# Patient Record
Sex: Male | Born: 1945 | Race: Black or African American | Hispanic: No | Marital: Married | Smoking: Never smoker
Health system: Southern US, Community
[De-identification: ages and names within clinical notes are randomized; demographics above are authoritative.]

## PROBLEM LIST (undated history)

## (undated) DIAGNOSIS — E119 Type 2 diabetes mellitus without complications: Secondary | ICD-10-CM

## (undated) DIAGNOSIS — C61 Malignant neoplasm of prostate: Secondary | ICD-10-CM

## (undated) DIAGNOSIS — I10 Essential (primary) hypertension: Secondary | ICD-10-CM

---

## 2012-09-20 ENCOUNTER — Encounter (HOSPITAL_COMMUNITY): Payer: Self-pay

## 2012-09-20 ENCOUNTER — Emergency Department (HOSPITAL_COMMUNITY): Payer: Self-pay

## 2012-09-20 ENCOUNTER — Emergency Department (HOSPITAL_COMMUNITY)
Admission: EM | Admit: 2012-09-20 | Discharge: 2012-09-20 | Disposition: A | Discharge status: Home / Self Care | Payer: Self-pay | Attending: Emergency Medicine | Admitting: Emergency Medicine

## 2012-09-20 DIAGNOSIS — L98499 Non-pressure chronic ulcer of skin of other sites with unspecified severity: Secondary | ICD-10-CM | POA: Insufficient documentation

## 2012-09-20 DIAGNOSIS — Y929 Unspecified place or not applicable: Secondary | ICD-10-CM | POA: Insufficient documentation

## 2012-09-20 DIAGNOSIS — N529 Male erectile dysfunction, unspecified: Secondary | ICD-10-CM | POA: Insufficient documentation

## 2012-09-20 DIAGNOSIS — Y93E1 Activity, personal bathing and showering: Secondary | ICD-10-CM | POA: Insufficient documentation

## 2012-09-20 DIAGNOSIS — S92919A Unspecified fracture of unspecified toe(s), initial encounter for closed fracture: Secondary | ICD-10-CM | POA: Insufficient documentation

## 2012-09-20 DIAGNOSIS — R296 Repeated falls: Secondary | ICD-10-CM | POA: Insufficient documentation

## 2012-09-20 DIAGNOSIS — E119 Type 2 diabetes mellitus without complications: Secondary | ICD-10-CM | POA: Insufficient documentation

## 2012-09-20 DIAGNOSIS — I10 Essential (primary) hypertension: Secondary | ICD-10-CM | POA: Insufficient documentation

## 2012-09-20 HISTORY — DX: Type 2 diabetes mellitus without complications: E11.9

## 2012-09-20 HISTORY — DX: Essential (primary) hypertension: I10

## 2012-09-20 MED ORDER — HYDROCODONE-ACETAMINOPHEN 5-325 MG PO TABS: 1.0000 | ORAL_TABLET | Freq: Four times a day (QID) | ORAL | Status: DC | PRN

## 2012-09-20 NOTE — ED Notes (Signed)
Patient reports that he fell 2 months ago, hitting his right foot on the shower. Patient now states that when his right foot touches something the pain radiates all the way up the leg and into his chest. Patient also c/o of vision problems at night and not being able to drive at night.

## 2012-09-20 NOTE — ED Notes (Signed)
[facility] tech contacted

## 2012-09-20 NOTE — ED Provider Notes (Signed)
Medical screening examination/treatment/procedure(s) were conducted as a shared visit with non-physician practitioner(s) and myself.  I personally evaluated the patient during the encounter On my exam the patient was in no distress.  He specifically requests additional evaluation of a right anterior calf lesion.  This was well healing and appearance with no surrounding erythema or discharge.  We discussed the need for primary care evaluation of his other complaints.  Absent distress he was discharged in stable condition  Gerhard Munch, MD 09/20/12 [phone]

## 2012-09-20 NOTE — ED Provider Notes (Signed)
History     CSN: [id number]  Arrival date & time 09/20/12  8295   First MD Initiated Contact with Patient 09/20/12 0848      Chief Complaint  Patient presents with  . Foot Pain  . Eye Problem    (Consider location/radiation/quality/duration/timing/severity/associated sxs/prior treatment) HPI Comments: This is a 66 year old male, who presents emergency department with chief complaint of right foot pain. Patient states that he is visiting from [place]. States that approximately 2 months ago he fell while showering, and sent than his right second toe has been swollen. He has not taken anything to alleviate his symptoms. States that the pain is worse when his toe is touched. Additionally, he complains of worsening visual acuity and erectile dysfunction. He denies headache, pain with eye movement, eye pain in general, chest pain, shortness of breath, nausea, vomiting, diarrhea, constipation, numbness or tingling of the extremities. States that the pain in his toe is moderate.  The history is provided by the patient. No language interpreter was used.    Past Medical History  Diagnosis Date  . Hypertension   . Diabetes mellitus without complication     History reviewed. No pertinent past surgical history.  No family history on file.  History  Substance Use Topics  . Smoking status: Never Smoker   . Smokeless tobacco: Never Used  . Alcohol Use: No      Review of Systems  All other systems reviewed and are negative.    Allergies  Review of patient's allergies indicates not on file.  Home Medications  No current outpatient prescriptions on file.  BP 160/82  Pulse 75  Temp 98.5 F (36.9 C) (Oral)  Resp 16  SpO2 97%  Physical Exam  Nursing note and vitals reviewed. Constitutional: He is oriented to person, place, and time. He appears well-developed and well-nourished.  HENT:  Head: Normocephalic and atraumatic.  Mouth/Throat: Oropharynx is clear and moist. No  oropharyngeal exudate.  Eyes: Conjunctivae normal and EOM are normal. Pupils are equal, round, and reactive to light. Right eye exhibits no discharge. Left eye exhibits no discharge. No scleral icterus.  Neck: Normal range of motion. Neck supple. No JVD present.  Cardiovascular: Normal rate, regular rhythm, normal heart sounds and intact distal pulses.  Exam reveals no gallop and no friction rub.   No murmur heard. Pulmonary/Chest: Effort normal and breath sounds normal. No respiratory distress. He has no wheezes. He has no rales. He exhibits no tenderness.  Abdominal: Soft. Bowel sounds are normal. He exhibits no distension and no mass. There is no tenderness. There is no rebound and no guarding.  Musculoskeletal: Normal range of motion. He exhibits tenderness. He exhibits no edema.       Right second toe tender to palpation, mildly swollen, with darkened skin.  Neurological: He is alert and oriented to person, place, and time. He has normal reflexes.       CN 3-12 intact  Skin: Skin is warm and dry.       Small ulceration of the right shin proximally 2 cm in diameter, appears to be Wagner grade 1  Psychiatric: He has a normal mood and affect. His behavior is normal. Judgment and thought content normal.    ED Course  Procedures (including critical care time)  No results found for this or any previous visit. Dg Foot Complete Right  09/20/2012  *RADIOLOGY REPORT*  Clinical Data: Larey Seat 2 months ago.  Pain and swelling of the second toe.  RIGHT FOOT COMPLETE -  3+ VIEW  Comparison: None.  Findings: There is an oblique fracture of the distal aspect of the proximal phalanx of the second toe which does extend to the articular surface.  No displacement or angulation.  There are mild degenerative changes of the metatarsal phalangeal joint of the great toe.  There are mild degenerative changes in the midfoot.  IMPRESSION: Fracture of the distal aspect of the proximal phalanx of the second toe extending to  the articular surface.  If this fracture is 2 months old, there is nonunion at this time.   Original Report Authenticated By: Paulina Fusi, M.D.       1. Toe fracture   2. Diabetes   3. Skin ulcer   4. ED (erectile dysfunction)       MDM   66 year old male with right toe fracture. I will give the patient a postop shoe, pain medicines, and orthopedic followup. Patient advised to followup with ophthalmology, and primary care for his other complaints. Patient is agreeable with this plan. Patient is stable and ready for discharge. Patient has been discussed with Dr. Jeraldine Loots, who agrees with the plan.       Roxy Horseman, PA-C 09/20/12 [phone]

## 2012-09-20 NOTE — ED Notes (Addendum)
Pt reports R foot, no obvious deformity noted at this time.  Pt also has a note "from my wife" stating that his penis does not work.  Pt also reports blurred vision at night.

## 2018-08-04 ENCOUNTER — Other Ambulatory Visit (HOSPITAL_COMMUNITY): Payer: Self-pay | Admitting: Urology

## 2018-08-04 DIAGNOSIS — C61 Malignant neoplasm of prostate: Secondary | ICD-10-CM

## 2018-08-20 ENCOUNTER — Ambulatory Visit (HOSPITAL_COMMUNITY)
Admission: RE | Admit: 2018-08-20 | Discharge: 2018-08-20 | Disposition: A | Discharge status: Home / Self Care | Payer: Self-pay | Source: Ambulatory Visit | Attending: Urology | Admitting: Urology

## 2018-08-20 DIAGNOSIS — C61 Malignant neoplasm of prostate: Secondary | ICD-10-CM

## 2018-08-20 MED ORDER — TECHNETIUM TC 99M MEDRONATE IV KIT
20.6000 | PACK | Freq: Once | INTRAVENOUS | Status: AC | PRN
  Administered 2018-08-20: 20.6 via INTRAVENOUS

## 2018-09-17 ENCOUNTER — Encounter: Payer: Self-pay | Admitting: Radiation Oncology

## 2018-09-17 NOTE — Progress Notes (Signed)
GU Location of Tumor / Histology: prostatic adenocarcinoma  If Prostate Cancer, Gleason Score is (4 + 4) and PSA is (96). Prostate volume: 87 cc.  Shawntez Dickison was referred by Dr. Orma Render to Dr. Karsten Ro in September 2019 for further evaluation of an elevated PSA.   Biopsies of prostate (if applicable) revealed:    Past/Anticipated interventions by urology, if any: prostate biopsy, CT (neg), bone scan (neg), referral to radiation oncology  Past/Anticipated interventions by medical oncology, if any: no  Weight changes, if any: no  Bowel/Bladder complaints, if any: IPSS 10. SHIM 1. Reports dysuria. Denies hematuria, leakage or incontinence.   Nausea/Vomiting, if any: no  Pain issues, if any:  no  SAFETY ISSUES:  Prior radiation? no  Pacemaker/ICD? no  Possible current pregnancy? no  Is the patient on methotrexate? no  Current Complaints / other details:  72 year old male. Married with two sons and one daughter. Brother with prostate cancer. NKDA. Instructed to follow up with PCP reference cough x 2 weeks.

## 2018-09-20 ENCOUNTER — Ambulatory Visit
Admission: RE | Admit: 2018-09-20 | Discharge: 2018-09-20 | Disposition: A | Discharge status: Home / Self Care | Payer: Self-pay | Source: Ambulatory Visit | Attending: Radiation Oncology | Admitting: Radiation Oncology

## 2018-09-20 ENCOUNTER — Other Ambulatory Visit: Payer: Self-pay

## 2018-09-20 ENCOUNTER — Encounter: Payer: Self-pay | Admitting: Medical Oncology

## 2018-09-20 ENCOUNTER — Encounter: Payer: Self-pay | Admitting: Radiation Oncology

## 2018-09-20 VITALS — BP 161/68 | HR 110 | Temp 99.5°F | Resp 20 | Wt 216.6 lb

## 2018-09-20 DIAGNOSIS — C61 Malignant neoplasm of prostate: Secondary | ICD-10-CM | POA: Insufficient documentation

## 2018-09-20 HISTORY — DX: Malignant neoplasm of prostate: C61

## 2018-09-20 NOTE — Progress Notes (Signed)
See progress note under physician encounter. 

## 2018-09-20 NOTE — Progress Notes (Signed)
Introduced myself to Mr. Oyster and his family as the prostate nurse navigator and my role. He is referred today for radiation and Lupron. He is not a natural citizen of the [place] and is here on a visa. He is planning to return home to [place] in a few weeks for a visit and returning March for radiation treatment. Due to no insurance, he needs assistance with Lupron. I spoke with Rob Kimble-drug replacement specialist will contact the daughter Alma Friendly after he investigates drug replacement. Alma Friendly is aware we will contact her regarding Lupron. I gave her my business card and asked her to call with questions or concerns.

## 2018-09-20 NOTE — Progress Notes (Signed)
2 Radiation Oncology         [phone] ________________________________  Initial Outpatient Consultation  Name: Caleb Robbins MRN: [id number]  Date: 09/20/2018  DOB: [birth year]  LY:YTKPTWS, No Pcp Per  Kathie Rhodes, MD   REFERRING PHYSICIAN: Kathie Rhodes, MD  DIAGNOSIS: 72 y.o. 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    ICD-10-CM   1. Malignant neoplasm of prostate (HCC) C61 Leuprolide Acetate (6 Month) (LUPRON) injection 45 mg    HISTORY OF PRESENT ILLNESS: Caleb Robbins is a 72 y.o. male with a new diagnosis of prostate cancer. He was noted to have an elevated PSA of 96 by his primary care physician, Dr. Orma Render.  Accordingly, he was referred for evaluation in urology by Dr. Karsten Ro on 06/22/18,  digital rectal examination was performed at that time revealing symmetrical abnormal lobe nodule.  The patient proceeded to transrectal ultrasound with 12 biopsies of the prostate on 07/27/18.  The prostate volume measured 87 cc.  Out of 12 core biopsies, 10 were positive.  The maximum Gleason score was 4+4, and this was seen in all positive cores.  The capsule appeared intact.  He underwent staging abdomen/pelvis CT and bone scans on 08/20/18 showing no findings of adenopathy or osseous metastatic disease. The patient reviewed the biopsy results with his urologist and he has kindly been referred today for discussion of potential radiation treatment options.   PREVIOUS RADIATION THERAPY: No  PAST MEDICAL HISTORY:  Past Medical History:  Diagnosis Date  . Diabetes mellitus without complication ([place])   . Hypertension   . Prostate cancer ([place])       PAST SURGICAL HISTORY:History reviewed. No pertinent surgical history.  FAMILY HISTORY:  Family History  Problem Relation Age of Onset  . Prostate cancer Brother   . Breast cancer Neg Hx   . Colon cancer Neg Hx   . Pancreatic cancer Neg Hx     SOCIAL HISTORY:    Social History   Socioeconomic History  . Marital status: Married    Spouse name: Not on file  . Number of children: 4  . Years of education: Not on file  . Highest education level: Not on file  Occupational History  . Not on file  Social Needs  . Financial resource strain: Not on file  . Food insecurity:    Worry: Not on file    Inability: Not on file  . Transportation needs:    Medical: Not on file    Non-medical: Not on file  Tobacco Use  . Smoking status: Never Smoker  . Smokeless tobacco: Never Used  Substance and Sexual Activity  . Alcohol use: No  . Drug use: No  . Sexual activity: Not Currently  Lifestyle  . Physical activity:    Days per week: Not on file    Minutes per session: Not on file  . Stress: Not on file  Relationships  . Social connections:    Talks on phone: Not on file    Gets together: Not on file    Attends religious service: Not on file    Active member of club or organization: Not on file    Attends meetings of clubs or organizations: Not on file    Relationship status: Not on file  . Intimate partner violence:    Fear of current or ex partner: Not on file    Emotionally abused: Not on file    Physically  abused: Not on file    Forced sexual activity: Not on file  Other Topics Concern  . Not on file  Social History Narrative  . Not on file  He is accompanied by his wife and daughter. He elects his daughter as his interpreter. She states he is came to visit from [place] about 2 months ago on a VISA and plans to return to [place] in the spring.  ALLERGIES: Patient has no known allergies.  MEDICATIONS:  Current Outpatient Medications  Medication Sig Dispense Refill  . Dextromethorphan-guaiFENesin (DIABETIC TUSSIN DM PO) Take by mouth.    . Garlic 4196 MG CAPS Take 1 capsule by mouth daily.    Marland Kitchen glimepiride (AMARYL) 2 MG tablet Take 2 mg by mouth daily with breakfast.    . losartan (COZAAR) 50 MG tablet Take 50 mg by mouth daily.    .  metFORMIN (GLUCOPHAGE) 500 MG tablet Take 500 mg by mouth 2 (two) times daily with a meal.    . vitamin B-12 (CYANOCOBALAMIN) 1000 MCG tablet Take 1,000 mcg by mouth daily.     No current facility-administered medications for this encounter.     REVIEW OF SYSTEMS:  On review of systems, the patient reports that he is doing well overall. He denies any chest pain, shortness of breath, cough, fevers, chills, night sweats, unintended weight changes. He denies any bowel disturbances, and denies abdominal pain, nausea or vomiting. He denies any new musculoskeletal or joint aches or pains. His IPSS was 10, indicating moderate urinary symptoms. He reports dysuria and denies hematuria and leakage or incontinence. He is unable to complete sexual activity. A complete review of systems is obtained and is otherwise negative.    PHYSICAL EXAM:  Wt Readings from Last 3 Encounters:  09/20/18 216 lb 9.6 oz (98.2 kg)   Temp Readings from Last 3 Encounters:  09/20/18 99.5 F (37.5 C) (Oral)  09/20/12 98.5 F (36.9 C) (Oral)   BP Readings from Last 3 Encounters:  09/20/18 (!) 161/68  09/20/12 160/82   Pulse Readings from Last 3 Encounters:  09/20/18 (!) 110  09/20/12 75   Pain Assessment Pain Score: 0-No pain/10  In general this is a well appearing African gentleman in no acute distress. He is alert and oriented x4 and appropriate throughout the examination. HEENT reveals that the patient is normocephalic, atraumatic. EOMs are intact.  Skin is intact without any evidence of gross lesions. Cardiopulmonary assessment is negative for acute distress and breathing exhibits normal effort.    KPS = 100  100 - Normal; no complaints; no evidence of disease. 90   - Able to carry on normal activity; minor signs or symptoms of disease. 80   - Normal activity with effort; some signs or symptoms of disease. 25   - Cares for self; unable to carry on normal activity or to do active work. 60   - Requires  occasional assistance, but is able to care for most of his personal needs. 50   - Requires considerable assistance and frequent medical care. 58   - Disabled; requires special care and assistance. 58   - Severely disabled; hospital admission is indicated although death not imminent. 66   - Very sick; hospital admission necessary; active supportive treatment necessary. 10   - Moribund; fatal processes progressing rapidly. 0     - Dead  Karnofsky DA, Abelmann WH, Craver LS and Burchenal [facility] [phone]) The use of the nitrogen mustards in the palliative treatment of carcinoma: with particular  reference to bronchogenic carcinoma Cancer 1 634-56  LABORATORY DATA:  No results found for: WBC, HGB, HCT, MCV, PLT No results found for: NA, K, CL, CO2 No results found for: ALT, AST, GGT, ALKPHOS, BILITOT   RADIOGRAPHY: No results found.    IMPRESSION/PLAN: 1. 72 y.o. 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 We discussed the patient's workup and outlines the nature of prostate cancer in this setting. The patient's T stage, Gleason's score, and PSA put him into the high risk group.  Accordingly, he is eligible for 8 weeks of external radiation with 2 years of androgen depravation. We discussed the available radiation techniques, and focused on the details and logistics and delivery. We discussed and outlined the risks, benefits, short and long-term effects associated with radiotherapy. Given the lack of insurance, our navigator is trying to coordinate indigent options for his lupron to be administered here at the cancer center.   In a visit lasting 60 minutes, greater than 50% of the time was spent face to face discussing his case, and coordinating the patient's care.    Carola Rhine, PAC    Tyler Pita, MD  [facility] Direct Dial: [phone]  Fax: [phone] [email or web address]  Skype  [email or web address]   This  document serves as a record of services personally performed by Shona Simpson, PA-C and Dr. Tammi Klippel. It was created on their behalf by Wilburn Mylar, a trained medical scribe. The creation of this record is based on the scribe's personal observations and the provider's statements to them. This document has been checked and approved by the attending provider.

## 2018-09-21 ENCOUNTER — Telehealth: Payer: Self-pay | Admitting: Medical Oncology

## 2018-09-22 ENCOUNTER — Encounter: Payer: Self-pay | Admitting: Pharmacy Technician

## 2018-09-22 NOTE — Progress Notes (Signed)
The patient is approved for drug assistance by [employer] for Lupron 45 mg.  Enrollment is effective until 09/22/19 and is based on self pay. The patient is expected to be scheduled for the first injection appointment before the end of the year.

## 2018-09-23 ENCOUNTER — Encounter: Payer: Self-pay | Admitting: Internal Medicine

## 2018-09-23 ENCOUNTER — Telehealth: Payer: Self-pay | Admitting: Radiation Oncology

## 2018-09-23 ENCOUNTER — Encounter: Payer: Self-pay | Admitting: Medical Oncology

## 2018-09-23 ENCOUNTER — Telehealth: Payer: Self-pay | Admitting: Medical Oncology

## 2018-09-23 NOTE — Telephone Encounter (Signed)
Spoke with Alma Friendly to confirm he father will be able come and sign forms for drug assistance-Lupron. I informed her, once we hear back and he is approved, we will get him scheduled for injection. She  Voiced understanding and is grateful for the care her father is receiving. I asked her to call with questions or concerns and I will continue to follow.

## 2018-09-23 NOTE — Progress Notes (Signed)
Rob Chadbourn notified me that patient was approved for Lupron replacement. He asked that patient be scheduled early next week for injection. POF sent for Monday 12/16. Patient will be notified.

## 2018-09-23 NOTE — Telephone Encounter (Signed)
Left message with Lydia-daughter that her father was approved for Lupron assistance. He is scheduled for injection 12/16 at 8:15 am. I asked her to call me to confirm appointment.

## 2018-09-23 NOTE — Telephone Encounter (Signed)
Scheduled appt per 12/12 sch message - pt daughter is aware of appt date and time

## 2018-09-27 ENCOUNTER — Inpatient Hospital Stay: Payer: Self-pay | Attending: Radiation Oncology

## 2018-09-27 ENCOUNTER — Encounter: Payer: Self-pay | Admitting: Medical Oncology

## 2018-09-27 DIAGNOSIS — Z79818 Long term (current) use of other agents affecting estrogen receptors and estrogen levels: Secondary | ICD-10-CM | POA: Insufficient documentation

## 2018-09-27 DIAGNOSIS — C61 Malignant neoplasm of prostate: Secondary | ICD-10-CM | POA: Insufficient documentation

## 2018-09-27 MED ORDER — LEUPROLIDE ACETATE (6 MONTH) 45 MG IM KIT
45.0000 mg | PACK | Freq: Once | INTRAMUSCULAR | Status: AC
  Administered 2018-09-27: 45 mg via INTRAMUSCULAR
  Filled 2018-09-27: qty 45

## 2018-09-27 NOTE — Progress Notes (Signed)
Pt stated he had not taken his BP meds and will take upon arrival home. Pt denies any headache or any complaints. Son with patient who verbalized understanding of importance of BP medication. Pt and son left ambulatory in no distress and had no further questions.

## 2018-09-27 NOTE — Patient Instructions (Signed)

## 2018-10-08 ENCOUNTER — Telehealth: Payer: Self-pay | Admitting: Medical Oncology

## 2018-10-08 NOTE — Telephone Encounter (Signed)
Lydia-daughter called stating that her father received a bill for consult with Dr. Tammi Klippel. We were able to provide him with Lupron thru drug replacement program. I informed her that Ailene Ravel from the radiation financial office will call her regarding patient assistance programs. He voiced understanding.

## 2018-10-20 ENCOUNTER — Encounter: Payer: Self-pay | Admitting: Pharmacy Technician

## 2018-10-20 NOTE — Progress Notes (Signed)
I contacted Alma Friendly, the patient's daughter about billing and patient assistance for his Lupron injections. I explained that the bill she received was processed before [facility] received the drug assistance enrollment approval and that I had contacted billing about his approval. She also asked about the assistance for his radiation treatments. I explained that I could only help with the cost of the injection drug and that I was unsure if assistance is available for radiation treatments and the doctor bills.  I told the patient that I would reach out to Cira Rue and ask her to call her back about any potential financial assistance.

## 2018-12-17 ENCOUNTER — Telehealth: Payer: Self-pay | Admitting: Radiation Oncology

## 2018-12-17 NOTE — Telephone Encounter (Signed)
Received voicemail message requesting return call about date next ADT injection is due. Phoned back. No answer. Left message on daughter, Lydia's, voicemail. Explained next injection is due in June. Explained her father should receive an ADT injection every six months for two years. Provided my contact number and requested she phone back because if her father has returned from [place] we need to gear up radiation. Awaiting return call.

## 2020-03-09 ENCOUNTER — Other Ambulatory Visit: Payer: Self-pay | Admitting: Urology

## 2020-03-09 ENCOUNTER — Other Ambulatory Visit (HOSPITAL_COMMUNITY): Payer: Self-pay | Admitting: Urology

## 2020-03-09 DIAGNOSIS — C61 Malignant neoplasm of prostate: Secondary | ICD-10-CM

## 2020-03-15 ENCOUNTER — Ambulatory Visit
Admission: RE | Admit: 2020-03-15 | Payer: Self-pay | Source: Ambulatory Visit | Attending: Radiation Oncology | Admitting: Radiation Oncology

## 2020-03-15 ENCOUNTER — Ambulatory Visit: Payer: Self-pay

## 2020-03-23 ENCOUNTER — Other Ambulatory Visit: Payer: Self-pay

## 2020-03-23 ENCOUNTER — Encounter (HOSPITAL_COMMUNITY)
Admission: RE | Admit: 2020-03-23 | Discharge: 2020-03-23 | Disposition: A | Discharge status: Home / Self Care | Payer: Self-pay | Source: Ambulatory Visit | Attending: Urology | Admitting: Urology

## 2020-03-23 ENCOUNTER — Encounter (HOSPITAL_COMMUNITY): Payer: Self-pay

## 2020-03-23 ENCOUNTER — Ambulatory Visit (HOSPITAL_COMMUNITY)
Admission: RE | Admit: 2020-03-23 | Discharge: 2020-03-23 | Disposition: A | Discharge status: Home / Self Care | Payer: Self-pay | Source: Ambulatory Visit | Attending: Urology | Admitting: Urology

## 2020-03-23 DIAGNOSIS — C61 Malignant neoplasm of prostate: Secondary | ICD-10-CM | POA: Insufficient documentation

## 2020-03-23 LAB — POCT I-STAT CREATININE: Creatinine, Ser: 1.4 mg/dL — ABNORMAL HIGH (ref 0.61–1.24)

## 2020-03-23 MED ORDER — SODIUM CHLORIDE (PF) 0.9 % IJ SOLN
INTRAMUSCULAR | Status: AC
  Filled 2020-03-23: qty 50

## 2020-03-23 MED ORDER — IOHEXOL 300 MG/ML  SOLN
100.0000 mL | Freq: Once | INTRAMUSCULAR | Status: AC | PRN
  Administered 2020-03-23: 100 mL via INTRAVENOUS

## 2020-03-23 MED ORDER — TECHNETIUM TC 99M MEDRONATE IV KIT
20.0000 | PACK | Freq: Once | INTRAVENOUS | Status: AC | PRN
  Administered 2020-03-23: 22 via INTRAVENOUS

## 2020-04-06 ENCOUNTER — Encounter: Payer: Self-pay | Admitting: Radiation Oncology

## 2020-04-06 ENCOUNTER — Ambulatory Visit
Admission: RE | Admit: 2020-04-06 | Discharge: 2020-04-06 | Disposition: A | Discharge status: Home / Self Care | Payer: Self-pay | Source: Ambulatory Visit | Attending: Radiation Oncology | Admitting: Radiation Oncology

## 2020-04-06 ENCOUNTER — Other Ambulatory Visit: Payer: Self-pay

## 2020-04-06 ENCOUNTER — Encounter: Payer: Self-pay | Admitting: Medical Oncology

## 2020-04-06 ENCOUNTER — Encounter (INDEPENDENT_AMBULATORY_CARE_PROVIDER_SITE_OTHER): Payer: Self-pay

## 2020-04-06 ENCOUNTER — Ambulatory Visit
Admission: RE | Admit: 2020-04-06 | Discharge: 2020-04-06 | Disposition: A | Discharge status: Home / Self Care | Payer: Self-pay | Source: Ambulatory Visit | Attending: Urology | Admitting: Urology

## 2020-04-06 VITALS — BP 168/76 | HR 64 | Temp 97.2°F | Resp 20 | Ht 66.0 in | Wt 211.0 lb

## 2020-04-06 DIAGNOSIS — I10 Essential (primary) hypertension: Secondary | ICD-10-CM | POA: Insufficient documentation

## 2020-04-06 DIAGNOSIS — C61 Malignant neoplasm of prostate: Secondary | ICD-10-CM

## 2020-04-06 DIAGNOSIS — Z7984 Long term (current) use of oral hypoglycemic drugs: Secondary | ICD-10-CM | POA: Insufficient documentation

## 2020-04-06 DIAGNOSIS — Z79899 Other long term (current) drug therapy: Secondary | ICD-10-CM | POA: Insufficient documentation

## 2020-04-06 DIAGNOSIS — E119 Type 2 diabetes mellitus without complications: Secondary | ICD-10-CM | POA: Insufficient documentation

## 2020-04-06 MED ORDER — LEUPROLIDE ACETATE (6 MONTH) 45 MG IM KIT: 45.0000 mg | PACK | Freq: Once | INTRAMUSCULAR | Status: DC

## 2020-04-06 NOTE — Progress Notes (Signed)
Patient states nocturia 4-5 times. Patient states having mild dysuria. Patient denies hematuria. Patient states bowel movements are regular. Patient states urine stream is strong and steady. Patient states that he is emptying his bladder completely. Patient states having urgency and sometimes he is able to make it to the bathroom. Patient denies having leakage. Patient denies having hesitancy or straining.  BP (!) 168/76 (BP Location: Right Arm, Patient Position: Sitting, Cuff Size: Large)   Pulse 64   Temp (!) 97.2 F (36.2 C)   Resp 20   Ht 5\' 6"  (1.676 m)   Wt 211 lb (95.7 kg)   SpO2 99%   BMI 34.06 kg/m

## 2020-04-06 NOTE — Progress Notes (Signed)
Spoke with Caleb Robbins to see if patient can return to [facility] to sign forms for patient assistance with [employer]. She can bring him in today. I informed her, once he gets approval and we have drug, we will schedule him for the injeciton. She voiced understanding.Pharmacy notified of the above.

## 2020-04-06 NOTE — Progress Notes (Signed)
I reintroduced myself to patient and his daughter, Caleb Robbins. I met them in 2019 when he consulted with Dr. Tammi Klippel and started ADT.  He returned home to [place] and was unable to return to the states, due to COVID restrictions. He re-consulted today with Ashlyn, PA and will restart ADT and then be scheduled for radiation.

## 2020-04-06 NOTE — Progress Notes (Signed)
2 Radiation Oncology         [phone] ________________________________   Outpatient Re-Consultation  Name: Issiah Huffaker MRN: [id number]  Date: 04/06/2020  DOB: May 01, 1946  MB:[id number], No Pcp Per  Kathie Rhodes, MD   REFERRING PHYSICIAN: Kathie Rhodes, MD  DIAGNOSIS: 74 y.o. 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    ICD-10-CM   1. Malignant neoplasm of prostate ([place])  C61     HISTORY OF PRESENT ILLNESS: Clifford Benninger is a 74 y.o. male with a diagnosis of prostate cancer. He was initially noted to have an elevated PSA of 96 by his primary care physician, Dr. Orma Render.  Accordingly, he was referred for evaluation in urology by Dr. Karsten Ro on 06/22/18,  digital rectal examination was performed at that time revealing symmetrical abnormal lobe nodules.  The patient proceeded to transrectal ultrasound with 12 biopsies of the prostate on 07/27/18.  The prostate volume measured 87 cc.  Out of 12 core biopsies, 10 were positive.  The maximum Gleason score was 4+4, and this was seen in all positive cores.  The capsule appeared intact.  He underwent staging abdomen/pelvis CT and bone scans on 08/20/18 showing no findings of adenopathy or osseous metastatic disease. The patient reviewed the biopsy results with his urologist and was kindly referred to [place] for discussion of potential radiation treatment options. We initially met with him and his daughter on 09/20/2018 and the decision at that time was to proceed with ADT concurrent with 8 weeks of prostate IMRT. He received a 6 month Lupron injection on 09/27/2018 with plans to move forward with radiation approximately 2 months thereafter.  However, the patient went back home to [place] and due to COVID-19 restrictions, was unable to get back into the [place] until recently.  He did not follow up with Dr. Karsten Ro until 03/06/20.  A repeat PSA was performed at that time and was further elevated at  122. He has not received any further ADT since his initial injection.  He returns today to resume treatment.   PREVIOUS RADIATION THERAPY: No  PAST MEDICAL HISTORY:  Past Medical History:  Diagnosis Date  . Diabetes mellitus without complication ([place])   . Hypertension   . Prostate cancer ([place])       PAST SURGICAL HISTORY:No past surgical history on file.  FAMILY HISTORY:  Family History  Problem Relation Age of Onset  . Prostate cancer Brother   . Breast cancer Neg Hx   . Colon cancer Neg Hx   . Pancreatic cancer Neg Hx     SOCIAL HISTORY:  Social History   Socioeconomic History  . Marital status: Married    Spouse name: Not on file  . Number of children: 4  . Years of education: Not on file  . Highest education level: Not on file  Occupational History  . Not on file  Tobacco Use  . Smoking status: Never Smoker  . Smokeless tobacco: Never Used  Vaping Use  . Vaping Use: Never used  Substance and Sexual Activity  . Alcohol use: No  . Drug use: No  . Sexual activity: Not Currently  Other Topics Concern  . Not on file  Social History Narrative  . Not on file   Social Determinants of Health   Financial Resource Strain:   . Difficulty of Paying Living Expenses:   Food Insecurity:   . Worried About Charity fundraiser in the Last  Year:   . Ran Out of Food in the Last Year:   Transportation Needs:   . Film/video editor (Medical):   Marland Kitchen Lack of Transportation (Non-Medical):   Physical Activity:   . Days of Exercise per Week:   . Minutes of Exercise per Session:   Stress:   . Feeling of Stress :   Social Connections:   . Frequency of Communication with Friends and Family:   . Frequency of Social Gatherings with Friends and Family:   . Attends Religious Services:   . Active Member of Clubs or Organizations:   . Attends Archivist Meetings:   Marland Kitchen Marital Status:   Intimate Partner Violence:   . Fear of Current or Ex-Partner:   . Emotionally  Abused:   Marland Kitchen Physically Abused:   . Sexually Abused:   He is accompanied by his wife and daughter. He elects his daughter as his interpreter. She states he is came to visit from [place] about 2 months ago on a VISA and plans to return to [place] in the spring.  ALLERGIES: Patient has no known allergies.  MEDICATIONS:  Current Outpatient Medications  Medication Sig Dispense Refill  . Dextromethorphan-guaiFENesin (DIABETIC TUSSIN DM PO) Take by mouth.    . Garlic 7829 MG CAPS Take 1 capsule by mouth daily.    Marland Kitchen glimepiride (AMARYL) 2 MG tablet Take 2 mg by mouth daily with breakfast.    . losartan (COZAAR) 50 MG tablet Take 50 mg by mouth daily.    . metFORMIN (GLUCOPHAGE) 500 MG tablet Take 500 mg by mouth 2 (two) times daily with a meal.    . vitamin B-12 (CYANOCOBALAMIN) 1000 MCG tablet Take 1,000 mcg by mouth daily.     No current facility-administered medications for this visit.    REVIEW OF SYSTEMS:  On review of systems, the patient reports that he is doing well overall. He denies any chest pain, shortness of breath, cough, fevers, chills, night sweats, unintended weight changes. He denies any bowel disturbances, and denies abdominal pain, nausea or vomiting. He denies any new musculoskeletal or joint aches or pains. His IPSS was 10, indicating moderate urinary symptoms. He reports dysuria and denies hematuria and leakage or incontinence. He is unable to complete sexual activity. A complete review of systems is obtained and is otherwise negative.    PHYSICAL EXAM:  Wt Readings from Last 3 Encounters:  09/20/18 216 lb 9.6 oz (98.2 kg)   Temp Readings from Last 3 Encounters:  09/27/18 98.9 F (37.2 C) (Oral)  09/20/18 99.5 F (37.5 C) (Oral)  09/20/12 98.5 F (36.9 C) (Oral)   BP Readings from Last 3 Encounters:  09/27/18 (!) 194/93  09/20/18 (!) 161/68  09/20/12 160/82   Pulse Readings from Last 3 Encounters:  09/27/18 88  09/20/18 (!) 110  09/20/12 75    /10  In general  this is a well appearing African gentleman in no acute distress. He is alert and oriented x4 and appropriate throughout the examination. HEENT reveals that the patient is normocephalic, atraumatic. EOMs are intact.  Skin is intact without any evidence of gross lesions. Cardiopulmonary assessment is negative for acute distress and breathing exhibits normal effort.    KPS = 100  100 - Normal; no complaints; no evidence of disease. 90   - Able to carry on normal activity; minor signs or symptoms of disease. 80   - Normal activity with effort; some signs or symptoms of disease. 70   - Cares  for self; unable to carry on normal activity or to do active work. 60   - Requires occasional assistance, but is able to care for most of his personal needs. 50   - Requires considerable assistance and frequent medical care. 32   - Disabled; requires special care and assistance. 43   - Severely disabled; hospital admission is indicated although death not imminent. 21   - Very sick; hospital admission necessary; active supportive treatment necessary. 10   - Moribund; fatal processes progressing rapidly. 0     - Dead  Karnofsky DA, Abelmann WH, Craver LS and Burchenal JH [phone]) The use of the nitrogen mustards in the palliative treatment of carcinoma: with particular reference to bronchogenic carcinoma Cancer 1 [id number]  LABORATORY DATA:  No results found for: WBC, HGB, HCT, MCV, PLT No results found for: NA, K, CL, CO2 No results found for: ALT, AST, GGT, ALKPHOS, BILITOT   RADIOGRAPHY: NM Bone Scan Whole Body  Result Date: 03/26/2020 CLINICAL DATA:  Prostate cancer with elevated serum PSA level. No acute injury or bone pain. EXAM: NUCLEAR MEDICINE WHOLE BODY BONE SCAN TECHNIQUE: Whole body anterior and posterior images were obtained approximately 3 hours after intravenous injection of radiopharmaceutical. RADIOPHARMACEUTICALS:  22.0 mCi Technetium-19mMDP IV COMPARISON:  Whole-body bone scan 08/20/2018.  Abdominopelvic CT 03/23/2020. FINDINGS: There is no osseous uptake suspicious for metastatic disease. Minimal degenerative uptake is present within the spine, similar to previous study. The soft tissue activity appears normal. IMPRESSION: Stable examination.  No evidence of osseous metastatic disease. Electronically Signed   By: WRichardean SaleM.D.   On: 03/26/2020 09:07   CT ABDOMEN PELVIS W CONTRAST  Result Date: 03/24/2020 CLINICAL DATA:  Prostate cancer.  Restaging.  PSA 122 on 03/07/2020. EXAM: CT ABDOMEN AND PELVIS WITH CONTRAST TECHNIQUE: Multidetector CT imaging of the abdomen and pelvis was performed using the standard protocol following bolus administration of intravenous contrast. CONTRAST:  1091mOMNIPAQUE IOHEXOL 300 MG/ML  SOLN COMPARISON:  08/20/2018 CT abdomen/pelvis. FINDINGS: Lower chest: No significant pulmonary nodules or acute consolidative airspace disease. Hepatobiliary: Normal liver size. No liver mass. Normal gallbladder with no radiopaque cholelithiasis. No biliary ductal dilatation. Pancreas: Normal, with no mass or duct dilation. Spleen: Normal size. No mass. Adrenals/Urinary Tract: Normal adrenals. No hydronephrosis. No renal masses. Chronic diffuse bladder wall thickening is not definitely changed. No significant bladder distention. Stomach/Bowel: Normal non-distended stomach. Normal caliber small bowel with no small bowel wall thickening. Normal appendix. Normal large bowel with no diverticulosis, large bowel wall thickening or pericolonic fat stranding. Vascular/Lymphatic: Atherosclerotic nonaneurysmal abdominal aorta. Patent portal, splenic, hepatic and renal veins. No pathologically enlarged lymph nodes in the abdomen or pelvis. Reproductive: Marked prostatomegaly. Prostate with 6.0 cm, previously 5.8 cm, minimally increased. Heterogeneous prostate enhancement. Other: No pneumoperitoneum, ascites or focal fluid collection. Musculoskeletal: No discrete osseous lesions. Moderate  thoracolumbar spondylosis. IMPRESSION: 1. No lymphadenopathy or other evidence of metastatic disease in the abdomen or pelvis. 2. Chronic diffuse bladder wall thickening, probably due to chronic bladder outlet obstruction by the markedly enlarged heterogeneous prostate. 3. Aortic Atherosclerosis (ICD10-I70.0). Electronically Signed   By: JaIlona Sorrel.D.   On: 03/24/2020 20:47      IMPRESSION/PLAN: 1. [age].o. 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 We discussed the patient's workup and outlines the nature of prostate cancer in this setting. The patient's T stage, Gleason's score, and PSA put him into the high risk  group.  Accordingly, he is eligible for 8 weeks of external radiation with 2 years of androgen deprivation. We discussed the available radiation techniques, and focused on the details and logistics and delivery. We discussed and outlined the risks, benefits, short and long-term effects associated with radiotherapy. Given the lack of insurance, our navigator is trying to coordinate indigent options for his lupron to be administered here at the cancer center.     Nicholos Johns, PA-C    Tyler Pita, MD  [facility] Direct Dial: [phone]  Fax: [phone] [email or web address]  Skype  LinkedIn

## 2020-04-09 ENCOUNTER — Encounter: Payer: Self-pay | Admitting: *Deleted

## 2020-04-09 ENCOUNTER — Encounter: Payer: Self-pay | Admitting: Radiation Oncology

## 2020-04-09 NOTE — Progress Notes (Signed)
Received referral from Michelle(Social worker) regarding financial assistance/grant.  Forwarded message to [place] to follow up due to patient being in Radiation.  Adrienne(Radiation Estate manager/land agent) and Neurosurgeon) connecting w/patient.

## 2020-04-09 NOTE — Progress Notes (Signed)
[place] Psychosocial Distress Screening Clinical Social Work  Clinical Social Work was referred by distress screening protocol.  The patient scored a 10 on the Psychosocial Distress Thermometer which indicates severe distress. Clinical Social Worker contacted patients daughter by phone to offer support and assess for distress and other psychosocial needs.  Patient recently returned from [place] and will be starting treatment at [facility].  Patients daughter stated patients primary cause of distress was financial.  Patients daughter, Alma Friendly stated patient is receiving a 50% discount from [facility], but bills are still very high.  Alma Friendly also stated RN navigator has started the process for drug assistance.  Patient is here on a visa and is uninsured.  Unfortunately, resources are very limited.  CSW informed Alma Friendly of the [place] and encouraged patient to apply.  CSW sent a message to the financial advocates with request of any additional assistance and assistance applying for [employer].  CSW will update family if any additional resources are identified.        ONCBCN DISTRESS SCREENING 04/06/2020  Screening Type   Distress experienced in past week (1-10) 10  Practical problem type Insurance  Emotional problem type   Physical Problem type   Physician notified of physical symptoms   Referral to clinical psychology   Referral to clinical social work   Referral to dietition   Referral to financial advocate Yes  Referral to support programs   Referral to palliative care     Johnnye Lana, MSW, LCSW, OSW-C Clinical Social Worker [place] [phone]

## 2020-05-01 ENCOUNTER — Other Ambulatory Visit: Payer: Self-pay

## 2020-05-01 ENCOUNTER — Telehealth: Payer: Self-pay | Admitting: Radiation Oncology

## 2020-05-01 ENCOUNTER — Encounter: Payer: Self-pay | Admitting: Medical Oncology

## 2020-05-01 NOTE — Telephone Encounter (Signed)
Scheduled appt per 7/20 sch msg -pt daughter is aware of appt

## 2020-05-01 NOTE — Progress Notes (Signed)
05/01/20  Orders received to add supportive plan for Lupron Depot 45 mg q 6 months.  Patient will receive medication thru patient assistance program.  T.O. Dr Odelia Gage. Elgie Congo, RN/Lani Mendiola Ronnald Ramp, PharmD

## 2020-05-02 ENCOUNTER — Encounter: Payer: Self-pay | Admitting: Pharmacy Technician

## 2020-05-02 ENCOUNTER — Other Ambulatory Visit: Payer: Self-pay

## 2020-05-02 ENCOUNTER — Inpatient Hospital Stay: Payer: Self-pay | Attending: Radiation Oncology

## 2020-05-02 VITALS — BP 172/73 | HR 71 | Temp 98.9°F | Resp 18

## 2020-05-02 DIAGNOSIS — Z5111 Encounter for antineoplastic chemotherapy: Secondary | ICD-10-CM | POA: Insufficient documentation

## 2020-05-02 DIAGNOSIS — C61 Malignant neoplasm of prostate: Secondary | ICD-10-CM | POA: Insufficient documentation

## 2020-05-02 MED ORDER — LEUPROLIDE ACETATE (6 MONTH) 45 MG IM KIT
45.0000 mg | PACK | Freq: Once | INTRAMUSCULAR | Status: AC
  Administered 2020-05-02: 45 mg via INTRAMUSCULAR
  Filled 2020-05-02: qty 45

## 2020-05-02 NOTE — Progress Notes (Signed)
Patient has been approved for drug assistance by [employer] for Lupron. The enrollment period is from 04/26/20-04/25/21 based on self pay. First DOS covered is 05/02/20.

## 2020-05-02 NOTE — Patient Instructions (Signed)
Leuprolide injection What is this medicine? LEUPROLIDE (loo PROE lide) is a man-made hormone. It is used to treat the symptoms of prostate cancer. This medicine may also be used to treat children with early onset of puberty. It may be used for other hormonal conditions. This medicine may be used for other purposes; ask your health care provider or pharmacist if you have questions. COMMON BRAND NAME(S): Lupron What should I tell my health care provider before I take this medicine? They need to know if you have any of these conditions:  diabetes  heart disease or previous heart attack  high blood pressure  high cholesterol  pain or difficulty passing urine  spinal cord metastasis  stroke  tobacco smoker  an unusual or allergic reaction to leuprolide, benzyl alcohol, other medicines, foods, dyes, or preservatives  pregnant or trying to get pregnant  breast-feeding How should I use this medicine? This medicine is for injection under the skin or into a muscle. You will be taught how to prepare and give this medicine. Use exactly as directed. Take your medicine at regular intervals. Do not take your medicine more often than directed. It is important that you put your used needles and syringes in a special sharps container. Do not put them in a trash can. If you do not have a sharps container, call your pharmacist or healthcare provider to get one. A special MedGuide will be given to you by the pharmacist with each prescription and refill. Be sure to read this information carefully each time. Talk to your pediatrician regarding the use of this medicine in children. While this medicine may be prescribed for children as young as 8 years for selected conditions, precautions do apply. Overdosage: If you think you have taken too much of this medicine contact a poison control center or emergency room at once. NOTE: This medicine is only for you. Do not share this medicine with others. What if  I miss a dose? If you miss a dose, take it as soon as you can. If it is almost time for your next dose, take only that dose. Do not take double or extra doses. What may interact with this medicine? Do not take this medicine with any of the following medications:  chasteberry This medicine may also interact with the following medications:  herbal or dietary supplements, like black cohosh or DHEA  male hormones, like estrogens or progestins and birth control pills, patches, rings, or injections  male hormones, like testosterone This list may not describe all possible interactions. Give your health care provider a list of all the medicines, herbs, non-prescription drugs, or dietary supplements you use. Also tell them if you smoke, drink alcohol, or use illegal drugs. Some items may interact with your medicine. What should I watch for while using this medicine? Visit your doctor or health care professional for regular checks on your progress. During the first week, your symptoms may get worse, but then will improve as you continue your treatment. You may get hot flashes, increased bone pain, increased difficulty passing urine, or an aggravation of nerve symptoms. Discuss these effects with your doctor or health care professional, some of them may improve with continued use of this medicine. Male patients may experience a menstrual cycle or spotting during the first 2 months of therapy with this medicine. If this continues, contact your doctor or health care professional. This medicine may increase blood sugar. Ask your healthcare provider if changes in diet or medicines are needed if   you have diabetes. What side effects may I notice from receiving this medicine? Side effects that you should report to your doctor or health care professional as soon as possible:  allergic reactions like skin rash, itching or hives, swelling of the face, lips, or tongue  breathing problems  chest  pain  depression or memory disorders  pain in your legs or groin  pain at site where injected  severe headache  signs and symptoms of high blood sugar such as being more thirsty or hungry or having to urinate more than normal. You may also feel very tired or have blurry vision  swelling of the feet and legs  visual changes  vomiting Side effects that usually do not require medical attention (report to your doctor or health care professional if they continue or are bothersome):  breast swelling or tenderness  decrease in sex drive or performance  diarrhea  hot flashes  loss of appetite  muscle, joint, or bone pains  nausea  redness or irritation at site where injected  skin problems or acne This list may not describe all possible side effects. Call your doctor for medical advice about side effects. You may report side effects to FDA at 1-800-FDA-1088. Where should I keep my medicine? Keep out of the reach of children. Store below 25 degrees C (77 degrees F). Do not freeze. Protect from light. Do not use if it is not clear or if there are particles present. Throw away any unused medicine after the expiration date. NOTE: This sheet is a summary. It may not cover all possible information. If you have questions about this medicine, talk to your doctor, pharmacist, or health care provider.  2020 Elsevier/Gold Standard (2018-07-29 09:52:48)  

## 2020-05-03 ENCOUNTER — Encounter: Payer: Self-pay | Admitting: Medical Oncology

## 2020-05-03 NOTE — Progress Notes (Signed)
Followed up with pharmacy regarding Lupron drug assistance. Patient was approved and orders placed.  Dr. Forrest Moron, PA notified. Scheduling message sent and patient will be notified.

## 2020-05-08 ENCOUNTER — Encounter: Payer: Self-pay | Admitting: Medical Oncology

## 2020-05-08 NOTE — Progress Notes (Signed)
Spoke with Alma Friendly- daughter, to let her know her father will not start radiation until late September. He received his ADT 7/21, and treatment begins approx 8 weeks after injection. I informed her he will be scheduled mid September with [facility], for placement of gold markers and [employer]. She states he would like to go home ([place]) for a visit and if possible, could he get appointment for gold makers scheduled. I will be happy to ask Enid Derry to check on this and we will be in contact with her. She voiced understanding.

## 2020-05-23 ENCOUNTER — Telehealth: Payer: Self-pay | Admitting: *Deleted

## 2020-05-23 NOTE — Telephone Encounter (Signed)
Called patient's daughter - Dorann Ou- to inform of consultation with Dr. Lovena Neighbours on 06-11-20 - arrival time- 3:45 pm, spoke with patient's daughter- Dorann Ou and she is aware of this appt.

## 2020-06-14 ENCOUNTER — Encounter: Payer: Self-pay | Admitting: Pharmacy Technician

## 2020-06-14 NOTE — Progress Notes (Signed)
Patient has been approved for drug assistance by [employer] for [place]. The enrollment period is from 06/07/20-10/12/20 based on self pay. First DOS covered is TBA.

## 2020-07-04 ENCOUNTER — Other Ambulatory Visit: Payer: Self-pay | Admitting: Urology

## 2020-07-06 ENCOUNTER — Other Ambulatory Visit: Payer: Self-pay | Admitting: Urology

## 2020-07-06 DIAGNOSIS — C61 Malignant neoplasm of prostate: Secondary | ICD-10-CM

## 2020-07-12 NOTE — Patient Instructions (Addendum)
DUE TO COVID-19 ONLY ONE VISITOR IS ALLOWED TO COME WITH YOU AND STAY IN THE WAITING ROOM ONLY DURING PRE OP AND PROCEDURE.   IF YOU WILL BE ADMITTED INTO THE HOSPITAL YOU ARE ALLOWED ONE SUPPORT PERSON DURING VISITATION HOURS ONLY (10AM -8PM)   . The support person may change daily. . The support person must pass our screening, gel in and out, and wear a mask at all times, including in the patient's room. . Patients must also wear a mask when staff or their support person are in the room.   COVID SWAB TESTING MUST BE COMPLETED ON:  Monday, 07-16-20 @ 11:40 AM   [address] [place], [place] [address]  (Must self quarantine after testing. Follow instructions on handout.)         Your procedure is scheduled on:  Wednesday, 07-18-20   Report to [facility] Main  Entrance   Report to admitting at 6:30 AM   Call this number if you have problems the morning of surgery [phone]    USE A FLEET ENEMA 2-3 HOURS PRIOR TO ARRIVAL AT [place]    Do not eat food :After Midnight.   May have liquids until 5:30 AM  day of surgery  CLEAR LIQUID DIET  Foods Allowed                                                                     Foods Excluded  Water, Black Coffee and tea, regular and decaf              liquids that you cannot  Plain Jell-O in any flavor  (No red)                                     see through such as: Fruit ices (not with fruit pulp)                                      milk, soups, orange juice              Iced Popsicles (No red)                                      All solid food                                   Apple juices Sports drinks like Gatorade (No red) Lightly seasoned clear broth or consume(fat free) Sugar, honey syrup    Oral Hygiene is also important to reduce your risk of infection.                                    Remember - BRUSH YOUR TEETH THE MORNING OF SURGERY WITH YOUR REGULAR TOOTHPASTE   Do NOT smoke after Midnight   Take  these medicines the morning of  surgery with A SIP OF WATER: None  How to Manage Your Diabetes Before and After Surgery  Why is it important to control my blood sugar before and after surgery? . Improving blood sugar levels before and after surgery helps healing and can limit problems. . A way of improving blood sugar control is eating a healthy diet by: o  Eating less sugar and carbohydrates o  Increasing activity/exercise o  Talking with your doctor about reaching your blood sugar goals . High blood sugars (greater than 180 mg/dL) can raise your risk of infections and slow your recovery, so you will need to focus on controlling your diabetes during the weeks before surgery. . Make sure that the doctor who takes care of your diabetes knows about your planned surgery including the date and location.  How do I manage my blood sugar before surgery? . Check your blood sugar at least 4 times a day, starting 2 days before surgery, to make sure that the level is not too high or low. o Check your blood sugar the morning of your surgery when you wake up and every 2 hours until you get to the Short Stay unit. . If your blood sugar is less than 70 mg/dL, you will need to treat for low blood sugar: o Do not take insulin. o Treat a low blood sugar (less than 70 mg/dL) with  cup of clear juice (cranberry or apple), 4 glucose tablets, OR glucose gel. o Recheck blood sugar in 15 minutes after treatment (to make sure it is greater than 70 mg/dL). If your blood sugar is not greater than 70 mg/dL on recheck, call [phone] for further instructions. . Report your blood sugar to the short stay nurse when you get to Short Stay.  . If you are admitted to the hospital after surgery: o Your blood sugar will be checked by the staff and you will probably be given insulin after surgery (instead of oral diabetes medicines) to make sure you have good blood sugar levels. o The goal for blood sugar control after surgery  is 80-180 mg/dL.   WHAT DO I DO ABOUT MY DIABETES MEDICATION?  Marland Kitchen Do not take oral diabetes medicines (pills) the morning of surgery.  . THE DAY BEFORE SURGERY:  Take Glimepiride in am/lunch, do not take evening.  Take Metformin as prescribed.       . THE MORNING OF SURGERY:  Do not take Glimepiride or Metformin.  F Reviewed and Endorsed by [facility] Patient Education Committee, August 2015  DO NOT TAKE ANY ORAL DIABETIC MEDICATIONS DAY OF YOUR SURGERY                               You may not have any metal on your body including hair pins, jewelry, and body piercings             Do not wear make-up, lotions, powders, perfumes/cologne, or deodorant             Do not wear nail polish.  Do not shave  48 hours prior to surgery.              Men may shave face and neck.   Do not bring valuables to the hospital. [place].   Contacts, dentures or bridgework may not be worn into surgery.  Bring small overnight bag day of surgery.    Patients discharged the day of surgery will not be allowed to drive home.   Special Instructions: Bring a copy of your healthcare power of attorney and living will documents         the day of surgery if you haven't scanned them in before.              Please read over the following fact sheets you were given: IF YOU HAVE QUESTIONS ABOUT YOUR PRE OP INSTRUCTIONS PLEASE CALL [phone]   [place] - Preparing for Surgery Before surgery, you can play an important role.  Because skin is not sterile, your skin needs to be as free of germs as possible.  You can reduce the number of germs on your skin by washing with CHG (chlorahexidine gluconate) soap before surgery.  CHG is an antiseptic cleaner which kills germs and bonds with the skin to continue killing germs even after washing. Please DO NOT use if you have an allergy to CHG or antibacterial soaps.  If your skin becomes reddened/irritated stop using  the CHG and inform your nurse when you arrive at Short Stay. Do not shave (including legs and underarms) for at least 48 hours prior to the first CHG shower.  You may shave your face/neck.  Please follow these instructions carefully:  1.  Shower with CHG Soap the night before surgery and the  morning of surgery.  2.  If you choose to wash your hair, wash your hair first as usual with your normal  shampoo.  3.  After you shampoo, rinse your hair and body thoroughly to remove the shampoo.                             4.  Use CHG as you would any other liquid soap.  You can apply chg directly to the skin and wash.  Gently with a scrungie or clean washcloth.  5.  Apply the CHG Soap to your body ONLY FROM THE NECK DOWN.   Do   not use on face/ open                           Wound or open sores. Avoid contact with eyes, ears mouth and   genitals (private parts).                       Wash face,  Genitals (private parts) with your normal soap.             6.  Wash thoroughly, paying special attention to the area where your    surgery  will be performed.  7.  Thoroughly rinse your body with warm water from the neck down.  8.  DO NOT shower/wash with your normal soap after using and rinsing off the CHG Soap.                9.  Pat yourself dry with a clean towel.            10.  Wear clean pajamas.            11.  Place clean sheets on your bed the night of your first shower and do not  sleep with pets. Day of Surgery : Do not apply any lotions/deodorants the morning of surgery.  Please wear clean clothes to the  hospital/surgery center.  FAILURE TO FOLLOW THESE INSTRUCTIONS MAY RESULT IN THE CANCELLATION OF YOUR SURGERY  PATIENT SIGNATURE_________________________________  NURSE SIGNATURE__________________________________  ________________________________________________________________________

## 2020-07-13 ENCOUNTER — Telehealth: Payer: Self-pay | Admitting: *Deleted

## 2020-07-13 NOTE — Telephone Encounter (Signed)
Called patient to inform of sim and MRI appt. For 07-20-20, unable to leave message due to mail box being full

## 2020-07-13 NOTE — Progress Notes (Signed)
COVID Vaccine Completed: Date COVID Vaccine completed: COVID vaccine manufacturer: [employer]   PCP - Benito Mccreedy, MD Cardiologist -   Chest x-ray -  EKG -  Stress Test -  ECHO -  Cardiac Cath -  Pacemaker/ICD device last checked:  Sleep Study -  CPAP -   Fasting Blood Sugar -  Checks Blood Sugar _____ times a day  Blood Thinner Instructions: Aspirin Instructions: Last Dose:  Anesthesia review:   Patient denies shortness of breath, fever, cough and chest pain at PAT appointment   Patient verbalized understanding of instructions that were given to them at the PAT appointment. Patient was also instructed that they will need to review over the PAT instructions again at home before surgery.

## 2020-07-16 ENCOUNTER — Other Ambulatory Visit (HOSPITAL_COMMUNITY): Payer: Self-pay

## 2020-07-16 ENCOUNTER — Encounter (HOSPITAL_COMMUNITY)
Admission: RE | Admit: 2020-07-16 | Discharge: 2020-07-16 | Disposition: A | Discharge status: Home / Self Care | Payer: Self-pay | Source: Ambulatory Visit | Attending: Anesthesiology | Admitting: Anesthesiology

## 2020-07-16 NOTE — Progress Notes (Signed)
Patient did not show for PAT appointment today.  Spoke to his daughter and she said that he is not feeling well and they will probably cancel surgery.  Daughter was given name and number of Dr. Jackson Latino surgery scheduler.  Spoke to [facility] at Dr. Jackson Latino office and made her aware.

## 2020-07-17 ENCOUNTER — Other Ambulatory Visit: Payer: Self-pay

## 2020-07-18 ENCOUNTER — Ambulatory Visit (HOSPITAL_COMMUNITY): Admission: RE | Admit: 2020-07-18 | Payer: Self-pay | Source: Home / Self Care | Admitting: Urology

## 2020-07-18 ENCOUNTER — Encounter (HOSPITAL_COMMUNITY): Admission: RE | Payer: Self-pay | Source: Home / Self Care

## 2020-07-18 ENCOUNTER — Other Ambulatory Visit: Payer: Self-pay

## 2020-07-18 DIAGNOSIS — Z20822 Contact with and (suspected) exposure to covid-19: Secondary | ICD-10-CM

## 2020-07-18 SURGERY — ULTRASOUND, RECTAL APPROACH
Anesthesia: General

## 2020-07-19 LAB — NOVEL CORONAVIRUS, NAA: SARS-CoV-2, NAA: NOT DETECTED

## 2020-07-19 LAB — SARS-COV-2, NAA 2 DAY TAT

## 2020-07-20 ENCOUNTER — Ambulatory Visit: Payer: Self-pay | Admitting: Radiation Oncology

## 2020-07-20 ENCOUNTER — Ambulatory Visit (HOSPITAL_COMMUNITY): Payer: Self-pay

## 2020-07-26 ENCOUNTER — Other Ambulatory Visit: Payer: Self-pay | Admitting: Urology

## 2020-08-06 NOTE — Patient Instructions (Addendum)
DUE TO COVID-19 ONLY ONE VISITOR IS ALLOWED TO COME WITH YOU AND STAY IN THE WAITING ROOM ONLY DURING PRE OP AND PROCEDURE DAY OF SURGERY. THE 1 VISITOR  MAY VISIT WITH YOU AFTER SURGERY IN YOUR PRIVATE ROOM DURING VISITING HOURS ONLY!  YOU NEED TO HAVE A COVID 19 TEST ON   10/30_ @__9 :25_____,  THIS TEST MUST BE DONE BEFORE SURGERY,  COVID TESTING SITE [place] [address],  IT IS ON THE RIGHT GOING OUT [address] APPROXIMATELY  2 MINUTES PAST [employer] ON THE RIGHT.   ONCE YOUR COVID TEST IS COMPLETED,  PLEASE BEGIN THE QUARANTINE INSTRUCTIONS AS OUTLINED IN YOUR HANDOUT.                Zaheer Wageman    Your procedure is scheduled on: 08/15/20   Report to [facility] Main  Entrance   Report to admitting at   10:30 AM     Call this number if you have problems the morning of surgery [phone]    Remember: Do not eat food or drink liquids :After Midnight.   BRUSH YOUR TEETH MORNING OF SURGERY AND RINSE YOUR MOUTH OUT, NO CHEWING GUM CANDY OR MINTS.     Take these medicines the morning of surgery with A SIP OF WATER: None     How to Manage Your Diabetes Before and After Surgery  Why is it important to control my blood sugar before and after surgery? . Improving blood sugar levels before and after surgery helps healing and can limit problems. . A way of improving blood sugar control is eating a healthy diet by: o  Eating less sugar and carbohydrates o  Increasing activity/exercise o  Talking with your doctor about reaching your blood sugar goals . High blood sugars (greater than 180 mg/dL) can raise your risk of infections and slow your recovery, so you will need to focus on controlling your diabetes during the weeks before surgery. . Make sure that the doctor who takes care of your diabetes knows about your planned surgery including the date and location.  How do I manage my blood sugar before surgery? . Check your blood sugar  at least 4 times a day, starting 2 days before surgery, to make sure that the level is not too high or low. o Check your blood sugar the morning of your surgery when you wake up and every 2 hours until you get to the Short Stay unit. . If your blood sugar is less than 70 mg/dL, you will need to treat for low blood sugar: o Do not take insulin. o Treat a low blood sugar (less than 70 mg/dL) with  cup of clear juice (cranberry or apple), 4 glucose tablets, OR glucose gel. o Recheck blood sugar in 15 minutes after treatment (to make sure it is greater than 70 mg/dL). If your blood sugar is not greater than 70 mg/dL on recheck, call [phone] for further instructions. . Report your blood sugar to the short stay nurse when you get to Short Stay.  . If you are admitted to the hospital after surgery: o Your blood sugar will be checked by the staff and you will probably be given insulin after surgery (instead of oral diabetes medicines) to make sure you have good blood sugar levels. o The goal for blood sugar control after surgery is 80-180 mg/dL.   WHAT DO I DO ABOUT MY DIABETES MEDICATION?  Marland Kitchen Do not take oral  diabetes medicines (pills) the morning of surgery.   . The day of surgery, do not take other diabetes injectables, including Byetta (exenatide), Bydureon (exenatide ER), Victoza (liraglutide), or Trulicity (dulaglutide).                               You may not have any metal on your body including              piercings  Do not wear jewelry,  lotions, powders or deodorant                        Men may shave face and neck.   Do not bring valuables to the hospital. [place].  Contacts, dentures or bridgework may not be worn into surgery.       Patients discharged the day of surgery will not be allowed to drive home.   IF YOU ARE HAVING SURGERY AND GOING HOME THE SAME DAY, YOU MUST HAVE AN ADULT TO DRIVE YOU HOME AND BE WITH YOU  FOR 24 HOURS.   YOU MAY GO HOME BY TAXI OR UBER OR ORTHERWISE, BUT AN ADULT MUST ACCOMPANY YOU HOME AND STAY WITH YOU FOR 24 HOURS.  Name and phone number of your driver:  Special Instructions: N/A              Please read over the following fact sheets you were given: _____________________________________________________________________             [facility]: [facility] - Preparing for Surgery Before surgery, you can play an important role.   Because skin is not sterile, your skin needs to be as free of germs as possible.   You can reduce the number of germs on your skin by washing with CHG (chlorahexidine gluconate) soap before surgery.   CHG is an antiseptic cleaner which kills germs and bonds with the skin to continue killing germs even after washing. Please DO NOT use if you have an allergy to CHG or antibacterial soaps.   If your skin becomes reddened/irritated stop using the CHG and inform your nurse when you arrive at Short Stay.   You may shave your face/neck.  Please follow these instructions carefully:  1.  Shower with CHG Soap the night before surgery and the  morning of Surgery.  2.  If you choose to wash your hair, wash your hair first as usual with your  normal  shampoo.  3.  After you shampoo, rinse your hair and body thoroughly to remove the  shampoo.                                        4.  Use CHG as you would any other liquid soap.  You can apply chg directly  to the skin and wash                       Gently with a scrungie or clean washcloth.  5.  Apply the CHG Soap to your body ONLY FROM THE NECK DOWN.   Do not use on face/ open  Wound or open sores. Avoid contact with eyes, ears mouth and genitals (private parts).                       Wash face,  Genitals (private parts) with your normal soap.             6.  Wash thoroughly, paying special attention to the area where your surgery  will be performed.  7.  Thoroughly rinse your body with warm  water from the neck down.  8.  DO NOT shower/wash with your normal soap after using and rinsing off  the CHG Soap.             9.  Pat yourself dry with a clean towel.            10.  Wear clean pajamas.            11.  Place clean sheets on your bed the night of your first shower and do not  sleep with pets. Day of Surgery : Do not apply any lotions/deodorants the morning of surgery.  Please wear clean clothes to the hospital/surgery center.  FAILURE TO FOLLOW THESE INSTRUCTIONS MAY RESULT IN THE CANCELLATION OF YOUR SURGERY PATIENT SIGNATURE_________________________________  NURSE SIGNATURE__________________________________  ________________________________________________________________________

## 2020-08-07 ENCOUNTER — Encounter (HOSPITAL_COMMUNITY)
Admission: RE | Admit: 2020-08-07 | Discharge: 2020-08-07 | Disposition: A | Discharge status: Home / Self Care | Payer: Self-pay | Source: Ambulatory Visit | Attending: Urology | Admitting: Urology

## 2020-08-07 ENCOUNTER — Other Ambulatory Visit: Payer: Self-pay

## 2020-08-07 ENCOUNTER — Encounter (HOSPITAL_COMMUNITY): Payer: Self-pay

## 2020-08-07 DIAGNOSIS — Z01818 Encounter for other preprocedural examination: Secondary | ICD-10-CM | POA: Insufficient documentation

## 2020-08-07 LAB — CBC
HCT: 36.1 % — ABNORMAL LOW (ref 39.0–52.0)
Hemoglobin: 12.1 g/dL — ABNORMAL LOW (ref 13.0–17.0)
MCH: 29.2 pg (ref 26.0–34.0)
MCHC: 33.5 g/dL (ref 30.0–36.0)
MCV: 87.2 fL (ref 80.0–100.0)
Platelets: 190 10*3/uL (ref 150–400)
RBC: 4.14 MIL/uL — ABNORMAL LOW (ref 4.22–5.81)
RDW: 13.2 % (ref 11.5–15.5)
WBC: 5.7 10*3/uL (ref 4.0–10.5)
nRBC: 0 % (ref 0.0–0.2)

## 2020-08-07 LAB — BASIC METABOLIC PANEL
Anion gap: 10 (ref 5–15)
BUN: 14 mg/dL (ref 8–23)
CO2: 27 mmol/L (ref 22–32)
Calcium: 10.4 mg/dL — ABNORMAL HIGH (ref 8.9–10.3)
Chloride: 103 mmol/L (ref 98–111)
Creatinine, Ser: 1.19 mg/dL (ref 0.61–1.24)
GFR, Estimated: >60 mL/min (ref >60)
Glucose, Bld: 157 mg/dL — ABNORMAL HIGH (ref 70–99)
Potassium: 4.4 mmol/L (ref 3.5–5.1)
Sodium: 140 mmol/L (ref 135–145)

## 2020-08-07 LAB — GLUCOSE, CAPILLARY: Glucose-Capillary: 144 mg/dL — ABNORMAL HIGH (ref 70–99)

## 2020-08-07 NOTE — Progress Notes (Addendum)
COVID Vaccine Completed:yes Date COVID Vaccine completed:07/24/20 COVID vaccine manufacturer: [employer] [place]  PCP - Dr. Darnell Level. Osei-Bonsu Cardiologist - none  Chest x-ray - no EKG - 10/26/21_chart Stress Test - no ECHO - no Cardiac Cath - no Pacemaker/ICD device last checked:NA  Sleep Study - no CPAP -   Fasting Blood Sugar - Pt tests QD with a meter from [place] Checks Blood Sugar _____ times a day  Blood Thinner Instructions:NA Aspirin Instructions: Last Dose:  Anesthesia review:   Patient denies shortness of breath, fever, cough and chest pain at PAT appointment yes   Patient verbalized understanding of instructions that were given to them at the PAT appointment. Patient was also instructed that they will need to review over the PAT instructions again at home before surgery. Yes Pt's daughter will be the interpeter for her Father. He has no SOB with activity. Pt had two teeth implants in [place] 07/24/20

## 2020-08-08 LAB — HEMOGLOBIN A1C
Hgb A1c MFr Bld: 7.2 % — ABNORMAL HIGH (ref 4.8–5.6)
Mean Plasma Glucose: 160 mg/dL

## 2020-08-11 ENCOUNTER — Other Ambulatory Visit (HOSPITAL_COMMUNITY)
Admission: RE | Admit: 2020-08-11 | Discharge: 2020-08-11 | Disposition: A | Discharge status: Home / Self Care | Payer: HRSA Program | Source: Ambulatory Visit | Attending: Urology | Admitting: Urology

## 2020-08-11 DIAGNOSIS — Z01812 Encounter for preprocedural laboratory examination: Secondary | ICD-10-CM | POA: Insufficient documentation

## 2020-08-11 DIAGNOSIS — Z20822 Contact with and (suspected) exposure to covid-19: Secondary | ICD-10-CM | POA: Diagnosis not present

## 2020-08-12 LAB — SARS CORONAVIRUS 2 (TAT 6-24 HRS): SARS Coronavirus 2: NEGATIVE

## 2020-08-15 ENCOUNTER — Ambulatory Visit (HOSPITAL_COMMUNITY)
Admission: RE | Admit: 2020-08-15 | Discharge: 2020-08-15 | Disposition: A | Discharge status: Home / Self Care | Payer: Self-pay | Source: Ambulatory Visit | Attending: Urology | Admitting: Urology

## 2020-08-15 ENCOUNTER — Encounter (HOSPITAL_COMMUNITY)
Admission: RE | Disposition: A | Discharge status: Home / Self Care | Payer: Self-pay | Source: Ambulatory Visit | Attending: Urology

## 2020-08-15 ENCOUNTER — Encounter (HOSPITAL_COMMUNITY): Payer: Self-pay | Admitting: Urology

## 2020-08-15 ENCOUNTER — Ambulatory Visit (HOSPITAL_COMMUNITY): Payer: Self-pay | Admitting: Emergency Medicine

## 2020-08-15 ENCOUNTER — Ambulatory Visit (HOSPITAL_COMMUNITY): Payer: Self-pay | Admitting: Registered Nurse

## 2020-08-15 ENCOUNTER — Ambulatory Visit (HOSPITAL_COMMUNITY): Payer: Self-pay

## 2020-08-15 DIAGNOSIS — C61 Malignant neoplasm of prostate: Secondary | ICD-10-CM

## 2020-08-15 HISTORY — PX: SPACE OAR INSTILLATION: SHX6769

## 2020-08-15 HISTORY — PX: GOLD SEED IMPLANT: SHX6343

## 2020-08-15 LAB — GLUCOSE, CAPILLARY
Glucose-Capillary: 168 mg/dL — ABNORMAL HIGH (ref 70–99)
Glucose-Capillary: 142 mg/dL — ABNORMAL HIGH (ref 70–99)

## 2020-08-15 SURGERY — INSERTION, GOLD SEEDS
Anesthesia: General

## 2020-08-15 MED ORDER — FENTANYL CITRATE (PF) 100 MCG/2ML IJ SOLN: 25.0000 ug | INTRAMUSCULAR | Status: DC | PRN

## 2020-08-15 MED ORDER — LACTATED RINGERS IV SOLN: INTRAVENOUS | Status: DC

## 2020-08-15 MED ORDER — LIDOCAINE HCL URETHRAL/MUCOSAL 2 % EX GEL
CUTANEOUS | Status: AC
  Filled 2020-08-15: qty 5

## 2020-08-15 MED ORDER — FENTANYL CITRATE (PF) 100 MCG/2ML IJ SOLN
INTRAMUSCULAR | Status: AC
  Filled 2020-08-15: qty 2

## 2020-08-15 MED ORDER — SODIUM CHLORIDE (PF) 0.9 % IJ SOLN
INTRAMUSCULAR | Status: AC
  Filled 2020-08-15: qty 50

## 2020-08-15 MED ORDER — PROPOFOL 10 MG/ML IV BOLUS
INTRAVENOUS | Status: AC
  Filled 2020-08-15: qty 20

## 2020-08-15 MED ORDER — ONDANSETRON HCL 4 MG/2ML IJ SOLN
INTRAMUSCULAR | Status: DC | PRN
  Administered 2020-08-15: 4 mg via INTRAVENOUS

## 2020-08-15 MED ORDER — DEXAMETHASONE SODIUM PHOSPHATE 10 MG/ML IJ SOLN
INTRAMUSCULAR | Status: DC | PRN
  Administered 2020-08-15: 5 mg via INTRAVENOUS

## 2020-08-15 MED ORDER — LIDOCAINE HCL URETHRAL/MUCOSAL 2 % EX GEL
CUTANEOUS | Status: AC
  Filled 2020-08-15: qty 30

## 2020-08-15 MED ORDER — OXYCODONE HCL 5 MG PO TABS
ORAL_TABLET | ORAL | Status: AC
  Filled 2020-08-15: qty 1

## 2020-08-15 MED ORDER — CHLORHEXIDINE GLUCONATE 0.12 % MT SOLN
15.0000 mL | Freq: Once | OROMUCOSAL | Status: AC
  Administered 2020-08-15: 15 mL via OROMUCOSAL

## 2020-08-15 MED ORDER — MIDAZOLAM HCL 5 MG/5ML IJ SOLN
INTRAMUSCULAR | Status: DC | PRN
  Administered 2020-08-15: 2 mg via INTRAVENOUS

## 2020-08-15 MED ORDER — ACETAMINOPHEN 10 MG/ML IV SOLN
INTRAVENOUS | Status: AC
  Filled 2020-08-15: qty 100

## 2020-08-15 MED ORDER — FENTANYL CITRATE (PF) 100 MCG/2ML IJ SOLN
INTRAMUSCULAR | Status: DC | PRN
  Administered 2020-08-15: 25 ug via INTRAVENOUS

## 2020-08-15 MED ORDER — LIDOCAINE 2% (20 MG/ML) 5 ML SYRINGE
INTRAMUSCULAR | Status: DC | PRN
  Administered 2020-08-15: 100 mg via INTRAVENOUS

## 2020-08-15 MED ORDER — ACETAMINOPHEN 10 MG/ML IV SOLN
1000.0000 mg | Freq: Once | INTRAVENOUS | Status: DC | PRN
  Administered 2020-08-15: 1000 mg via INTRAVENOUS

## 2020-08-15 MED ORDER — OXYCODONE HCL 5 MG PO TABS
5.0000 mg | ORAL_TABLET | Freq: Once | ORAL | Status: AC | PRN
  Administered 2020-08-15: 5 mg via ORAL

## 2020-08-15 MED ORDER — OXYCODONE HCL 5 MG/5ML PO SOLN: 5.0000 mg | Freq: Once | ORAL | Status: AC | PRN

## 2020-08-15 MED ORDER — CEFAZOLIN SODIUM-DEXTROSE 2-3 GM-%(50ML) IV SOLR
INTRAVENOUS | Status: DC | PRN
  Administered 2020-08-15: 2 g via INTRAVENOUS

## 2020-08-15 MED ORDER — STERILE WATER FOR INJECTION IJ SOLN
INTRAMUSCULAR | Status: AC
  Filled 2020-08-15: qty 10

## 2020-08-15 MED ORDER — ORAL CARE MOUTH RINSE: 15.0000 mL | Freq: Once | OROMUCOSAL | Status: AC

## 2020-08-15 MED ORDER — PROPOFOL 10 MG/ML IV BOLUS
INTRAVENOUS | Status: DC | PRN
  Administered 2020-08-15: 200 mg via INTRAVENOUS

## 2020-08-15 MED ORDER — CEFAZOLIN SODIUM-DEXTROSE 2-4 GM/100ML-% IV SOLN
INTRAVENOUS | Status: AC
  Filled 2020-08-15: qty 100

## 2020-08-15 MED ORDER — MIDAZOLAM HCL 2 MG/2ML IJ SOLN
INTRAMUSCULAR | Status: AC
  Filled 2020-08-15: qty 2

## 2020-08-15 SURGICAL SUPPLY — 15 items
DRSG TEGADERM 4X4.75 (GAUZE/BANDAGES/DRESSINGS) ×6 IMPLANT
DRSG TEGADERM 8X12 (GAUZE/BANDAGES/DRESSINGS) ×6 IMPLANT
DRSG TELFA 3X8 NADH (GAUZE/BANDAGES/DRESSINGS) IMPLANT
GLOVE BIO SURGEON STRL SZ7.5 (GLOVE) ×3 IMPLANT
GLOVE ECLIPSE 8.0 STRL XLNG CF (GLOVE) ×3 IMPLANT
GOWN STRL REUS W/TWL LRG LVL3 (GOWN DISPOSABLE) ×3 IMPLANT
IMPL SPACEOAR VUE SYSTEM (Spacer) ×1 IMPLANT
IMPLANT SPACEOAR VUE SYSTEM (Spacer) ×3 IMPLANT
KIT TURNOVER KIT A (KITS) ×3 IMPLANT
MARKER GOLD PRELOAD 1.2X3 (Urological Implant) ×1 IMPLANT
PACK CYSTO (CUSTOM PROCEDURE TRAY) ×3 IMPLANT
SEED GOLD PRELOAD 1.2X3 (Urological Implant) ×3 IMPLANT
SURGILUBE 2OZ TUBE FLIPTOP (MISCELLANEOUS) ×3 IMPLANT
TOWEL OR 17X26 10 PK STRL BLUE (TOWEL DISPOSABLE) ×3 IMPLANT
UNDERPAD 30X36 HEAVY ABSORB (UNDERPADS AND DIAPERS) ×3 IMPLANT

## 2020-08-15 NOTE — Discharge Instructions (Signed)
Hydrogel Spacer Implantation for Prostate Cancer, Care After This sheet gives you information about how to care for yourself after your procedure. Your health care provider may also give you more specific instructions. If you have problems or questions, contact your health care provider. What can I expect after the procedure? After the procedure, it is common to have:  A feeling of fullness in your rectum for a few days.  Pink-colored urine for a short time. This should clear as you increase your fluid intake. Follow these instructions at home: Medicines  Take over-the-counter and prescription medicines only as told by your health care provider.  If you were prescribed an antibiotic medicine, take it as told by your health care provider. Do not stop taking the antibiotic even if you start to feel better. Activity   Do not drive for 24 hours if you were given a sedative during your procedure.  Return to your normal activities as told by your health care provider. Ask your health care provider what activities are safe for you.  Do not lift anything that is heavier than 10 lb (4.5 kg), or the limit that you are told, until your health care provider says that it is safe. Eating and drinking   Drink enough fluid to keep your urine pale yellow.  Eat foods that are high in fiber, such as beans, whole grains, and fresh fruits and vegetables.  Limit foods that are high in fat and processed sugars, such as fried or sweet foods. General instructions  Do not place anything into your rectum for 3 months.  Keep all follow-up visits as told by your health care provider. This is important. ? You will need to return for an MRI test before starting your radiation treatment. Contact a health care provider if you have:  A fever.  Chills.  Rectal pain.  Pain when you urinate.  Trouble passing urine.  Blood in your urine or stool. Get help right away if you have:  Shortness of breath or  problems breathing. Summary  After the procedure, it is normal to have a feeling of fullness in your rectum for a few days.  Take over-the-counter and prescription medicines only as told by your health care provider.  Return to your normal activities as told by your health care provider.  Contact your health care provider if you have chills, a fever, pain, or blood in your urine or stool. This information is not intended to replace advice given to you by your health care provider. Make sure you discuss any questions you have with your health care provider. Document Revised: 04/01/2018 Document Reviewed: 04/01/2018 Elsevier Patient Education  2020 [employer].

## 2020-08-15 NOTE — Anesthesia Procedure Notes (Signed)
Procedure Name: LMA Insertion Date/Time: 08/15/2020 12:28 PM Performed by: Victoriano Lain, CRNA Pre-anesthesia Checklist: Patient identified, Emergency Drugs available, Suction available, Patient being monitored and Timeout performed Patient Re-evaluated:Patient Re-evaluated prior to induction Oxygen Delivery Method: Circle system utilized Preoxygenation: Pre-oxygenation with 100% oxygen Induction Type: IV induction LMA: LMA with gastric port inserted LMA Size: 4.0 Number of attempts: 1 Placement Confirmation: positive ETCO2 and breath sounds checked- equal and bilateral Tube secured with: Tape Dental Injury: Teeth and Oropharynx as per pre-operative assessment

## 2020-08-15 NOTE — Anesthesia Preprocedure Evaluation (Signed)
Anesthesia Evaluation  Patient identified by MRN, date of birth, ID band Patient awake    Reviewed: Allergy & Precautions, NPO status , Patient's Chart, lab work & pertinent test results  Airway Mallampati: II  TM Distance: >3 FB Neck ROM: Full    Dental no notable dental hx.    Pulmonary neg pulmonary ROS,    Pulmonary exam normal breath sounds clear to auscultation       Cardiovascular hypertension, Pt. on medications Normal cardiovascular exam Rhythm:Regular Rate:Normal     Neuro/Psych negative neurological ROS  negative psych ROS   GI/Hepatic negative GI ROS, Neg liver ROS,   Endo/Other  diabetes  Renal/GU negative Renal ROS  negative genitourinary   Musculoskeletal negative musculoskeletal ROS (+)   Abdominal   Peds negative pediatric ROS (+)  Hematology negative hematology ROS (+)   Anesthesia Other Findings   Reproductive/Obstetrics negative OB ROS                             Anesthesia Physical Anesthesia Plan  ASA: II  Anesthesia Plan: General   Post-op Pain Management:    Induction: Intravenous  PONV Risk Score and Plan: 2 and Ondansetron, Dexamethasone and Treatment may vary due to age or medical condition  Airway Management Planned: LMA  Additional Equipment:   Intra-op Plan:   Post-operative Plan: Extubation in OR  Informed Consent: I have reviewed the patients History and Physical, chart, labs and discussed the procedure including the risks, benefits and alternatives for the proposed anesthesia with the patient or authorized representative who has indicated his/her understanding and acceptance.     Dental advisory given  Plan Discussed with: CRNA and Surgeon  Anesthesia Plan Comments:         Anesthesia Quick Evaluation

## 2020-08-15 NOTE — Op Note (Signed)
Operative Note   Preoperative diagnosis:  1.  Gleason 8 prostate cancer   Postoperative diagnosis: 1.  Same    Procedure(s): 1.  Transrectal ultrasound guided prostatic gold seed fiducial marker and Space OAR placement   Surgeon: Ellison Hughs, MD   Assistants:  None    Anesthesia:  LMA   Complications:  None   EBL:  <5 mL   Specimens: 1. None   Drains/Catheters: 1.  None   Intraoperative findings:   1. Fiducial markers were placed at the prostatic base, mid-gland and apex.  SpaceOAR was placed with good separation of the prostate and rectum.    Indication: Caleb Robbins is a 74 y.o. 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 The patient was briefly lost to follow-up until this past June when he was seen by Dr. Karsten Ro. Repeat staging CT and bone scan in June of 2021 showed no evidence of metastatic prostate cancer. He has elected to proceed with external beam radiation therapy as primary treatment of his prostate cancer and received an Eligard injection on 05/02/2020 through the [facility].  He is here today for gold seed fiducial and SpaceOAR placement.  He has been consented for the above procedures, voices understanding and wishes to proceed.   Description of procedure:   After informed consent the patient was brought to the major OR, placed on the table and administered general anesthesia. He was then moved to the modified lithotomy position with his perineum perpendicular to the floor. His perineum and genitalia were then sterilely prepped. An official timeout was then performed.    Real time transrectal ultrasonography was used visualize the prostate.  Gold seed fiducial markers were then placed transperineally at the prostatic base, mid gland and apex.   I then proceeded with placement of SpaceOAR by introducing a needle with the bevel angled inferiorly approximately 2 cm superior to the anus. This was angled  downward and under direct ultrasound was placed within the space between the prostatic capsule and rectum. This was confirmed with a small amount of sterile saline injected and this was performed under direct ultrasound. I then attached the SpaceOAR to the needle and injected this in the space between the prostate and rectum with good placement noted.  The patient tolerated the procedure well and was transferred to the postanesthesia in stable condition.   Plan: Follow-up in 4 months with PSA

## 2020-08-15 NOTE — Transfer of Care (Signed)
Immediate Anesthesia Transfer of Care Note  Patient: Caleb Robbins  Procedure(s) Performed: GOLD SEED IMPLANT (N/A ) SPACE OAR INSTILLATION (N/A )  Patient Location: PACU  Anesthesia Type:General  Level of Consciousness: awake, alert , oriented and patient cooperative  Airway & Oxygen Therapy: Patient Spontanous Breathing and Patient connected to face mask oxygen  Post-op Assessment: Report given to RN, Post -op Vital signs reviewed and stable and Patient moving all extremities  Post vital signs: Reviewed and stable  Last Vitals:  Vitals Value Taken Time  BP 182/96 08/15/20 1258  Temp 36.9 C 08/15/20 1258  Pulse 83 08/15/20 1300  Resp 16 08/15/20 1300  SpO2 100 % 08/15/20 1300  Vitals shown include unvalidated device data.  Last Pain:  Vitals:   08/15/20 1032  TempSrc: Oral  PainSc:          Complications: No complications documented.

## 2020-08-15 NOTE — H&P (Signed)
Urology Preoperative H&P   Chief Complaint: prostate cancer   History of Present Illness: Keyion Knack is a 74 y.o. 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 The patient was briefly lost to follow-up until this past June when he was seen by Dr. Karsten Ro. Repeat staging CT and bone scan in June of 2021 showed no evidence of metastatic prostate cancer. He has elected to proceed with external beam radiation therapy as primary treatment of his prostate cancer and received an Eligard injection on 05/02/2020 through the [facility].   Past Medical History:  Diagnosis Date  . Diabetes mellitus without complication ([place])   . Hypertension   . Prostate cancer ([place])     No past surgical history on file.  Allergies: No Known Allergies  Family History  Problem Relation Age of Onset  . Prostate cancer Brother   . Breast cancer Neg Hx   . Colon cancer Neg Hx   . Pancreatic cancer Neg Hx     Social History:  reports that he has never smoked. He has never used smokeless tobacco. He reports that he does not drink alcohol and does not use drugs.  ROS: A complete review of systems was performed.  All systems are negative except for pertinent findings as noted.  Physical Exam:  Vital signs in last 24 hours:   Constitutional:  Alert and oriented, No acute distress Cardiovascular: Regular rate and rhythm, No JVD Respiratory: Normal respiratory effort, Lungs clear bilaterally GI: Abdomen is soft, nontender, nondistended, no abdominal masses GU: No CVA tenderness Lymphatic: No lymphadenopathy Neurologic: Grossly intact, no focal deficits Psychiatric: Normal mood and affect  Laboratory Data:  No results for input(s): WBC, HGB, HCT, PLT in the last 72 hours.  No results for input(s): NA, K, CL, GLUCOSE, BUN, CALCIUM, CREATININE in the last 72 hours.  Invalid input(s): CO3   No results found for this or any previous visit (from the  past 24 hour(s)). Recent Results (from the past 240 hour(s))  SARS CORONAVIRUS 2 (TAT 6-24 HRS) Nasopharyngeal Nasopharyngeal Swab     Status: None   Collection Time: 08/11/20  1:33 PM   Specimen: Nasopharyngeal Swab  Result Value Ref Range Status   SARS Coronavirus 2 NEGATIVE NEGATIVE Final    Comment: (NOTE) SARS-CoV-2 target nucleic acids are NOT DETECTED.  The SARS-CoV-2 RNA is generally detectable in upper and lower respiratory specimens during the acute phase of infection. Negative results do not preclude SARS-CoV-2 infection, do not rule out co-infections with other pathogens, and should not be used as the sole basis for treatment or other patient management decisions. Negative results must be combined with clinical observations, patient history, and epidemiological information. The expected result is Negative.  Fact Sheet for Patients: SugarRoll.be  Fact Sheet for Healthcare Providers: https://www.woods-mathews.com/  This test is not yet approved or cleared by the [place] FDA and  has been authorized for detection and/or diagnosis of SARS-CoV-2 by FDA under an Emergency Use Authorization (EUA). This EUA will remain  in effect (meaning this test can be used) for the duration of the COVID-19 declaration under Se ction 564(b)(1) of the Act, 21 U.S.C. section 360bbb-3(b)(1), unless the authorization is terminated or revoked sooner.  Performed at [facility], [place] [address] [place], [place] [address]     Renal Function: No results for input(s): CREATININE in the last 168 hours. Estimated Creatinine Clearance: 58.5 mL/min (by C-G formula based on SCr of  1.19 mg/dL).  Radiologic Imaging: CLINICAL DATA:  Prostate cancer.  Restaging.  PSA 122 on 03/07/2020.  EXAM: CT ABDOMEN AND PELVIS WITH CONTRAST  TECHNIQUE: Multidetector CT imaging of the abdomen and pelvis was performed using the standard protocol following  bolus administration of intravenous contrast.  CONTRAST:  165mL OMNIPAQUE IOHEXOL 300 MG/ML  SOLN  COMPARISON:  08/20/2018 CT abdomen/pelvis.  FINDINGS: Lower chest: No significant pulmonary nodules or acute consolidative airspace disease.  Hepatobiliary: Normal liver size. No liver mass. Normal gallbladder with no radiopaque cholelithiasis. No biliary ductal dilatation.  Pancreas: Normal, with no mass or duct dilation.  Spleen: Normal size. No mass.  Adrenals/Urinary Tract: Normal adrenals. No hydronephrosis. No renal masses. Chronic diffuse bladder wall thickening is not definitely changed. No significant bladder distention.  Stomach/Bowel: Normal non-distended stomach. Normal caliber small bowel with no small bowel wall thickening. Normal appendix. Normal large bowel with no diverticulosis, large bowel wall thickening or pericolonic fat stranding.  Vascular/Lymphatic: Atherosclerotic nonaneurysmal abdominal aorta. Patent portal, splenic, hepatic and renal veins. No pathologically enlarged lymph nodes in the abdomen or pelvis.  Reproductive: Marked prostatomegaly. Prostate with 6.0 cm, previously 5.8 cm, minimally increased. Heterogeneous prostate enhancement.  Other: No pneumoperitoneum, ascites or focal fluid collection.  Musculoskeletal: No discrete osseous lesions. Moderate thoracolumbar spondylosis.  IMPRESSION: 1. No lymphadenopathy or other evidence of metastatic disease in the abdomen or pelvis. 2. Chronic diffuse bladder wall thickening, probably due to chronic bladder outlet obstruction by the markedly enlarged heterogeneous prostate. 3. Aortic Atherosclerosis (ICD10-I70.0).   Electronically Signed   By: Ilona Sorrel M.D.   On: 03/24/2020 20:47  CLINICAL DATA:  Prostate cancer with elevated serum PSA level. No acute injury or bone pain.  EXAM: NUCLEAR MEDICINE WHOLE BODY BONE SCAN  TECHNIQUE: Whole body anterior and posterior  images were obtained approximately 3 hours after intravenous injection of radiopharmaceutical.  RADIOPHARMACEUTICALS:  22.0 mCi Technetium-31m MDP IV  COMPARISON:  Whole-body bone scan 08/20/2018. Abdominopelvic CT 03/23/2020.  FINDINGS: There is no osseous uptake suspicious for metastatic disease. Minimal degenerative uptake is present within the spine, similar to previous study. The soft tissue activity appears normal.  IMPRESSION: Stable examination.  No evidence of osseous metastatic disease.   Electronically Signed   By: Richardean Sale M.D.   On: 03/26/2020 09:07 I independently reviewed the above imaging studies.  Assessment and Plan Nayan Proch is a 74 y.o. male with Gleason 8 prostate cancer   -The patient was counseled about the natural history of prostate cancer and the standard treatment options that are available for prostate cancer. It was explained to him how his age and life expectancy, clinical stage, Gleason score, and PSA affect his prognosis, the decision to proceed with additional staging studies, as well as how that information influences recommended treatment strategies. We discussed the roles for active surveillance, radiation therapy, surgical therapy, androgen deprivation, as well as ablative therapy options for the treatment of prostate cancer as appropriate to his individual cancer situation. We discussed the risks and benefits of these options with regard to their impact on cancer control and also in terms of potential adverse events, complications, and impact on quality of life particularly related to urinary and sexual function. The patient was encouraged to ask questions throughout the discussion today and all questions were answered to his stated satisfaction. In addition, the patient was provided with and/or directed to appropriate resources and literature for further education about prostate cancer and treatment options.   Gold seed fiducial  marker  and SpaceOAR placement was discussed with the patient and his family. The risks, benefits and alternatives of the aforementioned procedures was discussed in detail. Risks include, bur are not limited to worsening LUTS, erectile dysfunction, rectal irritation, urethral stricture formation, fistula formation, cancer recurrence, MI, CVA, PE, DVT and the inherent risk of general anesthesia. He voices understanding and wishes to proceed.   Ellison Hughs, MD 08/15/2020, 8:13 AM  [facility] Pager: [phone]

## 2020-08-16 ENCOUNTER — Telehealth: Payer: Self-pay | Admitting: *Deleted

## 2020-08-16 NOTE — Telephone Encounter (Signed)
Called patient to inform that he doesn't need MRI for 08-17-20, spoke with patient's daughter- Dorann Ou and she is aware of this

## 2020-08-17 ENCOUNTER — Other Ambulatory Visit: Payer: Self-pay

## 2020-08-17 ENCOUNTER — Encounter: Payer: Self-pay | Admitting: Medical Oncology

## 2020-08-17 ENCOUNTER — Ambulatory Visit
Admission: RE | Admit: 2020-08-17 | Discharge: 2020-08-17 | Disposition: A | Discharge status: Home / Self Care | Payer: Self-pay | Source: Ambulatory Visit | Attending: Radiation Oncology | Admitting: Radiation Oncology

## 2020-08-17 ENCOUNTER — Encounter (HOSPITAL_COMMUNITY): Payer: Self-pay | Admitting: Urology

## 2020-08-17 ENCOUNTER — Ambulatory Visit (HOSPITAL_COMMUNITY): Admission: RE | Admit: 2020-08-17 | Payer: Self-pay | Source: Ambulatory Visit

## 2020-08-17 DIAGNOSIS — Z510 Encounter for antineoplastic radiation therapy: Secondary | ICD-10-CM | POA: Insufficient documentation

## 2020-08-17 DIAGNOSIS — C61 Malignant neoplasm of prostate: Secondary | ICD-10-CM | POA: Insufficient documentation

## 2020-08-17 NOTE — Progress Notes (Signed)
  Radiation Oncology         [phone] ________________________________  Name: Caleb Robbins MRN: [id number]  Date: 08/17/2020  DOB: July 21, 1946  SIMULATION AND TREATMENT PLANNING NOTE    ICD-10-CM   1. Malignant neoplasm of prostate ([place])  C61     DIAGNOSIS:  74 y.o. 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  NARRATIVE:  The patient was brought to the [facility].  Identity was confirmed.  All relevant records and images related to the planned course of therapy were reviewed.  The patient freely provided informed written consent to proceed with treatment after reviewing the details related to the planned course of therapy. The consent form was witnessed and verified by the simulation staff.  Then, the patient was set-up in a stable reproducible supine position for radiation therapy.  A vacuum lock pillow device was custom fabricated to position his legs in a reproducible immobilized position.  Then, I performed a urethrogram under sterile conditions to identify the prostatic apex.  CT images were obtained.  Surface markings were placed.  The CT images were loaded into the planning software.  Then the prostate target and avoidance structures including the rectum, bladder, bowel and hips were contoured.  Treatment planning then occurred.  The radiation prescription was entered and confirmed.  A total of one complex treatment devices was fabricated. I have requested : Intensity Modulated Radiotherapy (IMRT) is medically necessary for this case for the following reason:  Rectal sparing.Marland Kitchen  PLAN:  The prostate, seminal vesicles, and pelvic lymph nodes will initially be treated to 45 Gy in 25 fractions of 1.8 Gy followed by a boost to 75 Gy with 15 additional fractions of 2.0 Gy to the prostate only.   ________________________________  Sheral Apley Tammi Klippel, M.D.  This document serves as a record of services  personally performed by Tyler Pita, MD. It was created on his behalf by Wilburn Mylar, a trained medical scribe. The creation of this record is based on the scribe's personal observations and the provider's statements to them. This document has been checked and approved by the attending provider.

## 2020-08-21 ENCOUNTER — Telehealth: Payer: Self-pay | Admitting: Radiation Oncology

## 2020-08-21 NOTE — Telephone Encounter (Signed)
Emailed NEW PATIENT ENROLLMENT FORM to [email or web address].

## 2020-08-26 NOTE — Anesthesia Postprocedure Evaluation (Signed)
Anesthesia Post Note  Patient: Caleb Robbins  Procedure(s) Performed: GOLD SEED IMPLANT (N/A ) SPACE OAR INSTILLATION (N/A )     Patient location during evaluation: PACU Anesthesia Type: General Level of consciousness: awake and alert Pain management: pain level controlled Vital Signs Assessment: post-procedure vital signs reviewed and stable Respiratory status: spontaneous breathing, nonlabored ventilation, respiratory function stable and patient connected to nasal cannula oxygen Cardiovascular status: blood pressure returned to baseline and stable Postop Assessment: no apparent nausea or vomiting Anesthetic complications: no   No complications documented.  Last Vitals:  Vitals:   08/15/20 1315 08/15/20 1330  BP: (!) 162/98 (!) 179/94  Pulse: 68 77  Resp: 17 14  Temp:  37.1 C  SpO2: 99% 99%    Last Pain:  Vitals:   08/15/20 1330  TempSrc:   PainSc: 0-No pain                 Keison Glendinning S

## 2020-08-27 ENCOUNTER — Telehealth: Payer: Self-pay | Admitting: Internal Medicine

## 2020-08-27 NOTE — Telephone Encounter (Signed)
   Vonte Rossin DOB: Jan 12, 1946 MRN: [id number]   RIDER WAIVER AND RELEASE OF LIABILITY  For purposes of improving physical access to our facilities, [facility] is pleased to partner with third parties to provide [facility] patients or other authorized individuals the option of convenient, on-demand ground transportation services (the [place]") through use of the technology service that enables users to request on-demand ground transportation from independent third-party providers.  By opting to use and accept these [employer], I, the undersigned, hereby agree on behalf of myself, and on behalf of any minor child using the [employer] for whom I am the parent or legal guardian, as follows:  1. Government social research officer provided to me are provided by independent third-party transportation providers who are not [employer] or employees and who are unaffiliated with [employer]. 2. [facility] is neither a transportation carrier nor a common or public carrier. 3. [facility] has no control over the quality or safety of the transportation that occurs as a result of the [employer]. 4. [facility] cannot guarantee that any third-party transportation provider will complete any arranged transportation service. 5. [facility] makes no representation, warranty, or guarantee regarding the reliability, timeliness, quality, safety, suitability, or availability of any of the Transport Services or that they will be error free. 6. I fully understand that traveling by vehicle involves risks and dangers of serious bodily injury, including permanent disability, paralysis, and death. I agree, on behalf of myself and on behalf of any minor child using the Transport Services for whom I am the parent or legal guardian, that the entire risk arising out of my use of the [employer] remains solely with me, to the maximum extent permitted under applicable law. 7. The [employer] are provided "as is" and "as available." [facility] disclaims all representations and warranties, express, implied or statutory, not expressly set out in these terms, including the implied warranties of merchantability and fitness for a particular purpose. 8. I hereby waive and release [facility], its agents, employees, officers, directors, representatives, insurers, attorneys, assigns, successors, subsidiaries, and affiliates from any and all past, present, or future claims, demands, liabilities, actions, causes of action, or suits of any kind directly or indirectly arising from acceptance and use of the [employer]. 9. I further waive and release [facility] and its affiliates from all present and future liability and responsibility for any injury or death to persons or damages to property caused by or related to the use of the [employer]. 10. I have read this Waiver and Release of Liability, and I understand the terms used in it and their legal significance. This Waiver is freely and voluntarily given with the understanding that my right (as well as the right of any minor child for whom I am the parent or legal guardian using the [employer]) to legal recourse against [facility] in connection with the [employer] is knowingly surrendered in return for use of these services.   I attest that I read the consent document to Patrici Ranks, gave Mr. Goddard the opportunity to ask questions and answered the questions asked (if any). I affirm that Johannes Everage then provided consent for he's participation in this program.     Darrick Meigs Vilsaint

## 2020-08-28 ENCOUNTER — Ambulatory Visit
Admission: RE | Admit: 2020-08-28 | Discharge: 2020-08-28 | Disposition: A | Discharge status: Home / Self Care | Payer: Self-pay | Source: Ambulatory Visit | Attending: Radiation Oncology | Admitting: Radiation Oncology

## 2020-08-28 ENCOUNTER — Other Ambulatory Visit: Payer: Self-pay

## 2020-08-28 ENCOUNTER — Encounter: Payer: Self-pay | Admitting: Medical Oncology

## 2020-08-29 ENCOUNTER — Other Ambulatory Visit: Payer: Self-pay

## 2020-08-29 ENCOUNTER — Ambulatory Visit
Admission: RE | Admit: 2020-08-29 | Discharge: 2020-08-29 | Disposition: A | Discharge status: Home / Self Care | Payer: Self-pay | Source: Ambulatory Visit | Attending: Radiation Oncology | Admitting: Radiation Oncology

## 2020-08-30 ENCOUNTER — Ambulatory Visit
Admission: RE | Admit: 2020-08-30 | Discharge: 2020-08-30 | Disposition: A | Discharge status: Home / Self Care | Payer: Self-pay | Source: Ambulatory Visit | Attending: Radiation Oncology | Admitting: Radiation Oncology

## 2020-08-31 ENCOUNTER — Ambulatory Visit
Admission: RE | Admit: 2020-08-31 | Discharge: 2020-08-31 | Disposition: A | Discharge status: Home / Self Care | Payer: Self-pay | Source: Ambulatory Visit | Attending: Radiation Oncology | Admitting: Radiation Oncology

## 2020-09-03 ENCOUNTER — Other Ambulatory Visit: Payer: Self-pay

## 2020-09-03 ENCOUNTER — Ambulatory Visit
Admission: RE | Admit: 2020-09-03 | Discharge: 2020-09-03 | Disposition: A | Discharge status: Home / Self Care | Payer: Self-pay | Source: Ambulatory Visit | Attending: Radiation Oncology | Admitting: Radiation Oncology

## 2020-09-04 ENCOUNTER — Ambulatory Visit
Admission: RE | Admit: 2020-09-04 | Discharge: 2020-09-04 | Disposition: A | Discharge status: Home / Self Care | Payer: Self-pay | Source: Ambulatory Visit | Attending: Radiation Oncology | Admitting: Radiation Oncology

## 2020-09-04 ENCOUNTER — Encounter: Payer: Self-pay | Admitting: Radiation Oncology

## 2020-09-05 ENCOUNTER — Ambulatory Visit
Admission: RE | Admit: 2020-09-05 | Discharge: 2020-09-05 | Disposition: A | Discharge status: Home / Self Care | Payer: Self-pay | Source: Ambulatory Visit | Attending: Radiation Oncology | Admitting: Radiation Oncology

## 2020-09-10 ENCOUNTER — Ambulatory Visit
Admission: RE | Admit: 2020-09-10 | Discharge: 2020-09-10 | Disposition: A | Discharge status: Home / Self Care | Payer: Self-pay | Source: Ambulatory Visit | Attending: Radiation Oncology | Admitting: Radiation Oncology

## 2020-09-10 ENCOUNTER — Other Ambulatory Visit: Payer: Self-pay

## 2020-09-10 DIAGNOSIS — C61 Malignant neoplasm of prostate: Secondary | ICD-10-CM

## 2020-09-10 DIAGNOSIS — R3912 Poor urinary stream: Secondary | ICD-10-CM

## 2020-09-10 DIAGNOSIS — R300 Dysuria: Secondary | ICD-10-CM

## 2020-09-10 LAB — URINALYSIS, COMPLETE (UACMP) WITH MICROSCOPIC
Bilirubin Urine: NEGATIVE
Glucose, UA: 150 mg/dL — AB
Hgb urine dipstick: NEGATIVE
Ketones, ur: NEGATIVE mg/dL
Leukocytes,Ua: NEGATIVE
Nitrite: NEGATIVE
Protein, ur: NEGATIVE mg/dL
Specific Gravity, Urine: 1.009 (ref 1.005–1.030)
pH: 5 (ref 5.0–8.0)

## 2020-09-11 ENCOUNTER — Ambulatory Visit
Admission: RE | Admit: 2020-09-11 | Discharge: 2020-09-11 | Disposition: A | Discharge status: Home / Self Care | Payer: Self-pay | Source: Ambulatory Visit | Attending: Radiation Oncology | Admitting: Radiation Oncology

## 2020-09-11 LAB — URINE CULTURE: Culture: NO GROWTH

## 2020-09-12 ENCOUNTER — Ambulatory Visit
Admission: RE | Admit: 2020-09-12 | Discharge: 2020-09-12 | Disposition: A | Discharge status: Home / Self Care | Payer: Self-pay | Source: Ambulatory Visit | Attending: Radiation Oncology | Admitting: Radiation Oncology

## 2020-09-12 DIAGNOSIS — Z510 Encounter for antineoplastic radiation therapy: Secondary | ICD-10-CM | POA: Insufficient documentation

## 2020-09-12 DIAGNOSIS — C61 Malignant neoplasm of prostate: Secondary | ICD-10-CM | POA: Insufficient documentation

## 2020-09-12 NOTE — Progress Notes (Signed)
Met with patient at treatment machine to review UA and culture results. Explained he does not have a urinary tract infection. Verbalized that his early onset dysuria could simply mean he is more sensitive to the radiation. Advised he pick up OTC AZO and begin taking as directed on the box. Explained this medication will turn his urine amber in color and to no be alarmed. Encouraged patient to increase water intake to aid in reducing the acidity of his urine. Encouraged patient to contact this RN if dysuria doesn't less with the aid of AZO. Patient verbalized understanding of all reviewed.

## 2020-09-13 ENCOUNTER — Ambulatory Visit
Admission: RE | Admit: 2020-09-13 | Discharge: 2020-09-13 | Disposition: A | Discharge status: Home / Self Care | Payer: Self-pay | Source: Ambulatory Visit | Attending: Radiation Oncology | Admitting: Radiation Oncology

## 2020-09-14 ENCOUNTER — Ambulatory Visit
Admission: RE | Admit: 2020-09-14 | Discharge: 2020-09-14 | Disposition: A | Discharge status: Home / Self Care | Payer: Self-pay | Source: Ambulatory Visit | Attending: Radiation Oncology | Admitting: Radiation Oncology

## 2020-09-17 ENCOUNTER — Ambulatory Visit
Admission: RE | Admit: 2020-09-17 | Discharge: 2020-09-17 | Disposition: A | Discharge status: Home / Self Care | Payer: Self-pay | Source: Ambulatory Visit | Attending: Radiation Oncology | Admitting: Radiation Oncology

## 2020-09-18 ENCOUNTER — Ambulatory Visit
Admission: RE | Admit: 2020-09-18 | Discharge: 2020-09-18 | Disposition: A | Discharge status: Home / Self Care | Payer: Self-pay | Source: Ambulatory Visit | Attending: Radiation Oncology | Admitting: Radiation Oncology

## 2020-09-19 ENCOUNTER — Other Ambulatory Visit: Payer: Self-pay

## 2020-09-19 ENCOUNTER — Ambulatory Visit
Admission: RE | Admit: 2020-09-19 | Discharge: 2020-09-19 | Disposition: A | Discharge status: Home / Self Care | Payer: Self-pay | Source: Ambulatory Visit | Attending: Radiation Oncology | Admitting: Radiation Oncology

## 2020-09-20 ENCOUNTER — Ambulatory Visit
Admission: RE | Admit: 2020-09-20 | Discharge: 2020-09-20 | Disposition: A | Discharge status: Home / Self Care | Payer: Self-pay | Source: Ambulatory Visit | Attending: Radiation Oncology | Admitting: Radiation Oncology

## 2020-09-21 ENCOUNTER — Ambulatory Visit
Admission: RE | Admit: 2020-09-21 | Discharge: 2020-09-21 | Disposition: A | Discharge status: Home / Self Care | Payer: Self-pay | Source: Ambulatory Visit | Attending: Radiation Oncology | Admitting: Radiation Oncology

## 2020-09-24 ENCOUNTER — Ambulatory Visit
Admission: RE | Admit: 2020-09-24 | Discharge: 2020-09-24 | Disposition: A | Discharge status: Home / Self Care | Payer: Self-pay | Source: Ambulatory Visit | Attending: Radiation Oncology | Admitting: Radiation Oncology

## 2020-09-25 ENCOUNTER — Ambulatory Visit
Admission: RE | Admit: 2020-09-25 | Discharge: 2020-09-25 | Disposition: A | Discharge status: Home / Self Care | Payer: Self-pay | Source: Ambulatory Visit | Attending: Radiation Oncology | Admitting: Radiation Oncology

## 2020-09-26 ENCOUNTER — Ambulatory Visit
Admission: RE | Admit: 2020-09-26 | Discharge: 2020-09-26 | Disposition: A | Discharge status: Home / Self Care | Payer: Self-pay | Source: Ambulatory Visit | Attending: Radiation Oncology | Admitting: Radiation Oncology

## 2020-09-27 ENCOUNTER — Other Ambulatory Visit: Payer: Self-pay

## 2020-09-27 ENCOUNTER — Ambulatory Visit
Admission: RE | Admit: 2020-09-27 | Discharge: 2020-09-27 | Disposition: A | Discharge status: Home / Self Care | Payer: Self-pay | Source: Ambulatory Visit | Attending: Radiation Oncology | Admitting: Radiation Oncology

## 2020-09-28 ENCOUNTER — Other Ambulatory Visit: Payer: Self-pay

## 2020-09-28 ENCOUNTER — Other Ambulatory Visit: Payer: Self-pay | Admitting: Radiation Oncology

## 2020-09-28 ENCOUNTER — Ambulatory Visit
Admission: RE | Admit: 2020-09-28 | Discharge: 2020-09-28 | Disposition: A | Discharge status: Home / Self Care | Payer: Self-pay | Source: Ambulatory Visit | Attending: Radiation Oncology | Admitting: Radiation Oncology

## 2020-09-28 DIAGNOSIS — I10 Essential (primary) hypertension: Secondary | ICD-10-CM

## 2020-10-01 ENCOUNTER — Ambulatory Visit
Admission: RE | Admit: 2020-10-01 | Discharge: 2020-10-01 | Disposition: A | Discharge status: Home / Self Care | Payer: Self-pay | Source: Ambulatory Visit | Attending: Radiation Oncology | Admitting: Radiation Oncology

## 2020-10-02 ENCOUNTER — Ambulatory Visit: Payer: Self-pay

## 2020-10-03 ENCOUNTER — Ambulatory Visit
Admission: RE | Admit: 2020-10-03 | Discharge: 2020-10-03 | Disposition: A | Discharge status: Home / Self Care | Payer: Self-pay | Source: Ambulatory Visit | Attending: Radiation Oncology | Admitting: Radiation Oncology

## 2020-10-04 ENCOUNTER — Ambulatory Visit
Admission: RE | Admit: 2020-10-04 | Discharge: 2020-10-04 | Disposition: A | Discharge status: Home / Self Care | Payer: Self-pay | Source: Ambulatory Visit | Attending: Radiation Oncology | Admitting: Radiation Oncology

## 2020-10-08 ENCOUNTER — Ambulatory Visit
Admission: RE | Admit: 2020-10-08 | Discharge: 2020-10-08 | Disposition: A | Discharge status: Home / Self Care | Payer: Self-pay | Source: Ambulatory Visit | Attending: Radiation Oncology | Admitting: Radiation Oncology

## 2020-10-09 ENCOUNTER — Ambulatory Visit
Admission: RE | Admit: 2020-10-09 | Discharge: 2020-10-09 | Disposition: A | Discharge status: Home / Self Care | Payer: Self-pay | Source: Ambulatory Visit | Attending: Radiation Oncology | Admitting: Radiation Oncology

## 2020-10-10 ENCOUNTER — Telehealth: Payer: Self-pay | Admitting: *Deleted

## 2020-10-10 ENCOUNTER — Ambulatory Visit
Admission: RE | Admit: 2020-10-10 | Discharge: 2020-10-10 | Disposition: A | Discharge status: Home / Self Care | Payer: Self-pay | Source: Ambulatory Visit | Attending: Radiation Oncology | Admitting: Radiation Oncology

## 2020-10-10 NOTE — Telephone Encounter (Signed)
Called patient to inform of appt. with Dr. Larinda Buttery on 10-15-20 - arrival time- 2:40 pm, spoke with patient's daughterIsabelle Course and she is aware of this appt.

## 2020-10-11 ENCOUNTER — Ambulatory Visit
Admission: RE | Admit: 2020-10-11 | Discharge: 2020-10-11 | Disposition: A | Discharge status: Home / Self Care | Payer: Self-pay | Source: Ambulatory Visit | Attending: Radiation Oncology | Admitting: Radiation Oncology

## 2020-10-15 ENCOUNTER — Ambulatory Visit: Payer: Self-pay | Admitting: Medical-Surgical

## 2020-10-15 ENCOUNTER — Ambulatory Visit
Admission: RE | Admit: 2020-10-15 | Discharge: 2020-10-15 | Disposition: A | Discharge status: Home / Self Care | Payer: Self-pay | Source: Ambulatory Visit | Attending: Radiation Oncology | Admitting: Radiation Oncology

## 2020-10-15 DIAGNOSIS — Z510 Encounter for antineoplastic radiation therapy: Secondary | ICD-10-CM | POA: Insufficient documentation

## 2020-10-15 DIAGNOSIS — C61 Malignant neoplasm of prostate: Secondary | ICD-10-CM | POA: Insufficient documentation

## 2020-10-16 ENCOUNTER — Ambulatory Visit
Admission: RE | Admit: 2020-10-16 | Discharge: 2020-10-16 | Disposition: A | Discharge status: Home / Self Care | Payer: Self-pay | Source: Ambulatory Visit | Attending: Radiation Oncology | Admitting: Radiation Oncology

## 2020-10-17 ENCOUNTER — Ambulatory Visit
Admission: RE | Admit: 2020-10-17 | Discharge: 2020-10-17 | Disposition: A | Discharge status: Home / Self Care | Payer: Self-pay | Source: Ambulatory Visit | Attending: Radiation Oncology | Admitting: Radiation Oncology

## 2020-10-17 ENCOUNTER — Encounter: Payer: Self-pay | Admitting: Medical-Surgical

## 2020-10-17 ENCOUNTER — Other Ambulatory Visit: Payer: Self-pay

## 2020-10-17 ENCOUNTER — Ambulatory Visit (INDEPENDENT_AMBULATORY_CARE_PROVIDER_SITE_OTHER): Payer: Self-pay | Admitting: Medical-Surgical

## 2020-10-17 VITALS — BP 163/75 | HR 88 | Temp 98.7°F | Ht 68.0 in | Wt 207.3 lb

## 2020-10-17 DIAGNOSIS — I10 Essential (primary) hypertension: Secondary | ICD-10-CM

## 2020-10-17 DIAGNOSIS — Z114 Encounter for screening for human immunodeficiency virus [HIV]: Secondary | ICD-10-CM

## 2020-10-17 DIAGNOSIS — Z7689 Persons encountering health services in other specified circumstances: Secondary | ICD-10-CM

## 2020-10-17 DIAGNOSIS — Z1159 Encounter for screening for other viral diseases: Secondary | ICD-10-CM

## 2020-10-17 DIAGNOSIS — E1165 Type 2 diabetes mellitus with hyperglycemia: Secondary | ICD-10-CM

## 2020-10-17 DIAGNOSIS — N529 Male erectile dysfunction, unspecified: Secondary | ICD-10-CM | POA: Insufficient documentation

## 2020-10-17 MED ORDER — LOSARTAN POTASSIUM 100 MG PO TABS: 100.0000 mg | ORAL_TABLET | Freq: Every day | ORAL | 1 refills | Status: DC

## 2020-10-17 MED ORDER — GLIMEPIRIDE 2 MG PO TABS: 2.0000 mg | ORAL_TABLET | Freq: Two times a day (BID) | ORAL | 1 refills | Status: DC

## 2020-10-17 MED ORDER — METFORMIN HCL 1000 MG PO TABS: 1000.0000 mg | ORAL_TABLET | Freq: Two times a day (BID) | ORAL | 1 refills | Status: DC

## 2020-10-17 MED ORDER — TAMSULOSIN HCL 0.4 MG PO CAPS: 0.4000 mg | ORAL_CAPSULE | Freq: Every day | ORAL | 1 refills | Status: DC

## 2020-10-17 NOTE — Progress Notes (Signed)
New Patient Office Visit  Subjective:  Patient ID: Caleb Robbins, male    DOB: [birth year]  Age: 75 y.o. MRN: [id number]  CC:  Chief Complaint  Patient presents with  . Establish Care    HPI Caleb Robbins presents to establish care.  He is from [place] and currently here for treatment of prostate cancer.  He is being managed by oncology and currently receiving treatments.  Hypertension-he does not check his blood pressure at home because he does not have a working blood pressure cuff at this time.  He did recently purchase a wrist blood pressure cuff but is unable to get it to work appropriately.  Taking losartan 50 mg daily, tolerating well without side effects.  Notes that he has been on this medication for many years. Denies CP, SOB, palpitations, lower extremity edema, dizziness, headaches, or vision changes.  Diabetes-he is type II diabetic with most recent A1c of 7.4%.  Taking Metformin 1000 mg twice daily and glimepiride 2 mg twice daily, tolerating both well without side effects.  Checks his sugar regularly at home and keeps a log in a small notebook.  Sugars have been very good fasting with some readings in the 70s.  Past Medical History:  Diagnosis Date  . Diabetes mellitus without complication (HCC)   . Hypertension   . Prostate cancer [facility])     Past Surgical History:  Procedure Laterality Date  . GOLD SEED IMPLANT N/A 08/15/2020   Procedure: GOLD SEED IMPLANT;  Surgeon: Rene Paci, MD;  Location: [facility];  Service: Urology;  Laterality: N/A;  . SPACE OAR INSTILLATION N/A 08/15/2020   Procedure: SPACE OAR INSTILLATION;  Surgeon: Rene Paci, MD;  Location: [facility];  Service: Urology;  Laterality: N/A;    Family History  Problem Relation Age of Onset  . Prostate cancer Brother   . Breast cancer Neg Hx   . Colon cancer Neg Hx   . Pancreatic cancer Neg Hx     Social History   Socioeconomic History  . Marital status: Married    Spouse name: Not  on file  . Number of children: 4  . Years of education: Not on file  . Highest education level: Not on file  Occupational History  . Not on file  Tobacco Use  . Smoking status: Never Smoker  . Smokeless tobacco: Never Used  Vaping Use  . Vaping Use: Never used  Substance and Sexual Activity  . Alcohol use: No  . Drug use: No  . Sexual activity: Not Currently    Partners: Female  Other Topics Concern  . Not on file  Social History Narrative  . Not on file   Social Determinants of Health   Financial Resource Strain: Not on file  Food Insecurity: Not on file  Transportation Needs: Not on file  Physical Activity: Not on file  Stress: Not on file  Social Connections: Not on file  Intimate Partner Violence: Not on file    ROS Review of Systems  Constitutional: Negative for chills, fatigue, fever and unexpected weight change.  Respiratory: Negative for cough, chest tightness, shortness of breath and wheezing.   Cardiovascular: Negative for chest pain, palpitations and leg swelling.  Genitourinary: Positive for urgency. Negative for dysuria and frequency.  Neurological: Negative for dizziness, light-headedness and headaches.  Psychiatric/Behavioral: Negative for dysphoric mood, self-injury, sleep disturbance and suicidal ideas. The patient is not nervous/anxious.     Objective:   Today's Vitals: BP (!) 163/75   Pulse 88  Temp 98.7 F (37.1 C)   Ht 5\' 8"  (1.727 m)   Wt 207 lb 4.8 oz (94 kg)   SpO2 97%   BMI 31.52 kg/m   Physical Exam Vitals and nursing note reviewed.  Constitutional:      General: He is not in acute distress.    Appearance: Normal appearance.  HENT:     Head: Normocephalic and atraumatic.  Cardiovascular:     Rate and Rhythm: Normal rate and regular rhythm.     Pulses: Normal pulses.     Heart sounds: Normal heart sounds. No murmur heard. No friction rub. No gallop.   Pulmonary:     Effort: Pulmonary effort is normal. No respiratory  distress.     Breath sounds: Normal breath sounds.  Skin:    General: Skin is warm and dry.  Neurological:     Mental Status: He is alert and oriented to person, place, and time.  Psychiatric:        Mood and Affect: Mood normal.        Behavior: Behavior normal.        Thought Content: Thought content normal.        Judgment: Judgment normal.     Assessment & Plan:   1. Encounter to establish care Reviewed available information and discussed health care needs with patient.  2. Screening for HIV/hepatitis C screening Discussed screening recommendations.  Given that he has no insurance, we will defer this testing for now.  3. Essential hypertension Increasing losartan to 100 mg daily.  Advised patient and daughter to get a new blood pressure cuff that measures on the arm instead of the wrist and to check his blood pressure daily.  When they return for his 2-week nurse visit, advised to bring the new blood pressure cuff with them for verification of accuracy.  4. Type 2 diabetes mellitus with hyperglycemia, without long-term current use of insulin (HCC) Continue glimepiride 2 mg twice daily and Metformin 1000 mg twice daily.  Continue monitoring glucose at home.  Medication refills provided.  Outpatient Encounter Medications as of 10/17/2020  Medication Sig  . ibuprofen (ADVIL) 800 MG tablet Take 800 mg by mouth every 8 (eight) hours as needed for headache or moderate pain.  . Multiple Vitamin (MULTIVITAMIN WITH MINERALS) TABS tablet Take 1 tablet by mouth daily.  . phenazopyridine (AZO URINARY PAIN RELIEF) 95 MG tablet Take 190 mg by mouth 3 (three) times daily.  . sildenafil (VIAGRA) 50 MG tablet Take 1 tablet by mouth daily as needed.  . [DISCONTINUED] glimepiride (AMARYL) 2 MG tablet Take 2 mg by mouth 2 (two) times daily.   . [DISCONTINUED] losartan (COZAAR) 50 MG tablet Take 50 mg by mouth daily.  . [DISCONTINUED] metFORMIN (GLUCOPHAGE) 1000 MG tablet Take 1,000 mg by mouth 2  (two) times daily.  . [DISCONTINUED] tamsulosin (FLOMAX) 0.4 MG CAPS capsule Take 0.4 mg by mouth daily.  12/15/2020 glimepiride (AMARYL) 2 MG tablet Take 1 tablet (2 mg total) by mouth 2 (two) times daily.  Marland Kitchen losartan (COZAAR) 100 MG tablet Take 1 tablet (100 mg total) by mouth daily.  . metFORMIN (GLUCOPHAGE) 1000 MG tablet Take 1 tablet (1,000 mg total) by mouth 2 (two) times daily.  . tamsulosin (FLOMAX) 0.4 MG CAPS capsule Take 1 capsule (0.4 mg total) by mouth daily.   No facility-administered encounter medications on file as of 10/17/2020.    Follow-up: Return in about 2 weeks (around 10/31/2020) for nurse visit for BP check.  Clearnce Sorrel, DNP, APRN, FNP-BC [facility]

## 2020-10-17 NOTE — Patient Instructions (Signed)
Advance Directive  Advance directives are legal documents that let you make choices ahead of time about your health care and medical treatment in case you become unable to communicate for yourself. Advance directives are a way for you to make known your wishes to family, friends, and health care providers. This can let others know about your end-of-life care if you become unable to communicate. Discussing and writing advance directives should happen over time rather than all at once. Advance directives can be changed depending on your situation and what you want, even after you have signed the advance directives. There are different types of advance directives, such as:  Medical power of attorney.  Living will.  Do not resuscitate (DNR) or do not attempt resuscitation (DNAR) order. Health care proxy and medical power of attorney A health care proxy is also called a health care agent. This is a person who is appointed to make medical decisions for you in cases where you are unable to make the decisions yourself. Generally, people choose someone they know well and trust to represent their preferences. Make sure to ask this person for an agreement to act as your proxy. A proxy may have to exercise judgment in the event of a medical decision for which your wishes are not known. A medical power of attorney is a legal document that names your health care proxy. Depending on the laws in your state, after the document is written, it may also need to be:  Signed.  Notarized.  Dated.  Copied.  Witnessed.  Incorporated into your medical record. You may also want to appoint someone to manage your money in a situation in which you are unable to do so. This is called a durable power of attorney for finances. It is a separate legal document from the durable power of attorney for health care. You may choose the same person or someone different from your health care proxy to act as your agent in money  matters. If you do not appoint a proxy, or if there is a concern that the proxy is not acting in your best interests, a court may appoint a guardian to act on your behalf. Living will A living will is a set of instructions that state your wishes about medical care when you cannot express them yourself. Health care providers should keep a copy of your living will in your medical record. You may want to give a copy to family members or friends. To alert caregivers in case of an emergency, you can place a card in your wallet to let them know that you have a living will and where they can find it. A living will is used if you become:  Terminally ill.  Disabled.  Unable to communicate or make decisions. Items to consider in your living will include:  To use or not to use life-support equipment, such as dialysis machines and breathing machines (ventilators).  A DNR or DNAR order. This tells health care providers not to use cardiopulmonary resuscitation (CPR) if breathing or heartbeat stops.  To use or not to use tube feeding.  To be given or not to be given food and fluids.  Comfort (palliative) care when the goal becomes comfort rather than a cure.  Donation of organs and tissues. A living will does not give instructions for distributing your money and property if you should pass away. DNR or DNAR A DNR or DNAR order is a request not to have CPR in the event that  your heart stops beating or you stop breathing. If a DNR or DNAR order has not been made and shared, a health care provider will try to help any patient whose heart has stopped or who has stopped breathing. If you plan to have surgery, talk with your health care provider about how your DNR or DNAR order will be followed if problems occur. What if I do not have an advance directive? If you do not have an advance directive, some states assign family decision makers to act on your behalf based on how closely you are related to them. Each  state has its own laws about advance directives. You may want to check with your health care provider, attorney, or state representative about the laws in your state. Summary  Advance directives are the legal documents that allow you to make choices ahead of time about your health care and medical treatment in case you become unable to tell others about your care.  The process of discussing and writing advance directives should happen over time. You can change the advance directives, even after you have signed them.  Advance directives include DNR or DNAR orders, living wills, and designating an agent as your medical power of attorney. This information is not intended to replace advice given to you by your health care provider. Make sure you discuss any questions you have with your health care provider. Document Revised: 04/28/2019 Document Reviewed: 04/28/2019 Elsevier Patient Education  2020 [employer]  

## 2020-10-18 ENCOUNTER — Ambulatory Visit
Admission: RE | Admit: 2020-10-18 | Discharge: 2020-10-18 | Disposition: A | Discharge status: Home / Self Care | Payer: Self-pay | Source: Ambulatory Visit | Attending: Radiation Oncology | Admitting: Radiation Oncology

## 2020-10-18 ENCOUNTER — Telehealth: Payer: Self-pay

## 2020-10-18 DIAGNOSIS — I10 Essential (primary) hypertension: Secondary | ICD-10-CM

## 2020-10-18 MED ORDER — LOSARTAN POTASSIUM 50 MG PO TABS: 100.0000 mg | ORAL_TABLET | Freq: Every day | ORAL | 0 refills | Status: DC

## 2020-10-18 NOTE — Telephone Encounter (Signed)
New prescription sent for 2 tablets once daily.

## 2020-10-18 NOTE — Telephone Encounter (Signed)
Pt's daughter Isabelle Course aware that the new Rx has been sent to the pharmacy for the 50 mg dose and that he should take 2 tablets at the same time. No further questions or concerns at this time.

## 2020-10-18 NOTE — Telephone Encounter (Signed)
Pt's daughter called in and said that he was not able to take his BP med this morning because the pharmacy said they did not have it and that they would not be getting a shipment until the end of the month. I called [facility] pharmacy and they said that losartan 100 mg is on back order and they do not know when they will be getting it in, but they do have the losartan 50 mg in stock but will need a new Rx sent in for the pt. Rx has been tee'd up below and is ready for review and approval/denial.

## 2020-10-19 ENCOUNTER — Ambulatory Visit
Admission: RE | Admit: 2020-10-19 | Discharge: 2020-10-19 | Disposition: A | Discharge status: Home / Self Care | Payer: Self-pay | Source: Ambulatory Visit | Attending: Radiation Oncology | Admitting: Radiation Oncology

## 2020-10-19 ENCOUNTER — Other Ambulatory Visit: Payer: Self-pay

## 2020-10-22 ENCOUNTER — Ambulatory Visit
Admission: RE | Admit: 2020-10-22 | Discharge: 2020-10-22 | Disposition: A | Discharge status: Home / Self Care | Payer: Self-pay | Source: Ambulatory Visit | Attending: Radiation Oncology | Admitting: Radiation Oncology

## 2020-10-23 ENCOUNTER — Encounter: Payer: Self-pay | Admitting: Medical Oncology

## 2020-10-23 ENCOUNTER — Ambulatory Visit
Admission: RE | Admit: 2020-10-23 | Discharge: 2020-10-23 | Disposition: A | Discharge status: Home / Self Care | Payer: Self-pay | Source: Ambulatory Visit | Attending: Radiation Oncology | Admitting: Radiation Oncology

## 2020-10-23 NOTE — Progress Notes (Signed)
Spoke with Lydia-daughter to inform her of Lupron injection 1/24 @ 12:00 pm. She voiced understanding of appointment.

## 2020-10-24 ENCOUNTER — Ambulatory Visit
Admission: RE | Admit: 2020-10-24 | Discharge: 2020-10-24 | Disposition: A | Discharge status: Home / Self Care | Payer: Self-pay | Source: Ambulatory Visit | Attending: Radiation Oncology | Admitting: Radiation Oncology

## 2020-10-25 ENCOUNTER — Ambulatory Visit
Admission: RE | Admit: 2020-10-25 | Discharge: 2020-10-25 | Disposition: A | Discharge status: Home / Self Care | Payer: Self-pay | Source: Ambulatory Visit | Attending: Radiation Oncology | Admitting: Radiation Oncology

## 2020-10-26 ENCOUNTER — Ambulatory Visit
Admission: RE | Admit: 2020-10-26 | Discharge: 2020-10-26 | Disposition: A | Discharge status: Home / Self Care | Payer: Self-pay | Source: Ambulatory Visit | Attending: Radiation Oncology | Admitting: Radiation Oncology

## 2020-10-26 ENCOUNTER — Ambulatory Visit: Payer: Self-pay

## 2020-10-29 ENCOUNTER — Ambulatory Visit: Payer: Self-pay

## 2020-10-30 ENCOUNTER — Ambulatory Visit
Admission: RE | Admit: 2020-10-30 | Discharge: 2020-10-30 | Disposition: A | Discharge status: Home / Self Care | Payer: Self-pay | Source: Ambulatory Visit | Attending: Radiation Oncology | Admitting: Radiation Oncology

## 2020-10-30 ENCOUNTER — Encounter: Payer: Self-pay | Admitting: Medical Oncology

## 2020-10-30 ENCOUNTER — Encounter: Payer: Self-pay | Admitting: Radiation Oncology

## 2020-10-30 ENCOUNTER — Other Ambulatory Visit: Payer: Self-pay

## 2020-10-31 ENCOUNTER — Ambulatory Visit: Payer: Self-pay

## 2020-11-05 ENCOUNTER — Inpatient Hospital Stay: Payer: Self-pay | Attending: Urology

## 2020-11-05 ENCOUNTER — Other Ambulatory Visit: Payer: Self-pay

## 2020-11-05 VITALS — BP 155/72 | HR 97 | Resp 19

## 2020-11-05 DIAGNOSIS — C61 Malignant neoplasm of prostate: Secondary | ICD-10-CM | POA: Insufficient documentation

## 2020-11-05 DIAGNOSIS — Z5111 Encounter for antineoplastic chemotherapy: Secondary | ICD-10-CM | POA: Insufficient documentation

## 2020-11-05 MED ORDER — LEUPROLIDE ACETATE (6 MONTH) 45 MG ~~LOC~~ KIT
45.0000 mg | PACK | Freq: Once | SUBCUTANEOUS | Status: AC
  Administered 2020-11-05: 45 mg via SUBCUTANEOUS
  Filled 2020-11-05: qty 45

## 2020-11-05 MED ORDER — LEUPROLIDE ACETATE (6 MONTH) 45 MG IM KIT: 45.0000 mg | PACK | Freq: Once | INTRAMUSCULAR | Status: DC

## 2020-11-05 NOTE — Progress Notes (Signed)
  Radiation Oncology         [phone] ________________________________  Name: Caleb Robbins MRN: [id number]  Date: 10/30/2020  DOB: 1946-07-18  End of Treatment Note  Diagnosis:   75 y.o. 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     Indication for treatment:  Curative, Definitive Radiotherapy       Radiation treatment dates:   08/28/20-10/30/20  Site/dose:  1. The prostate, seminal vesicles, and pelvic lymph nodes were initially treated to 45 Gy in 25 fractions of 1.8 Gy  2. The prostate only was boosted to 75 Gy with 15 additional fractions of 2.0 Gy   Beams/energy:  1. The prostate, seminal vesicles, and pelvic lymph nodes were initially treated using VMAT intensity modulated radiotherapy delivering 6 megavolt photons. Image guidance was performed with CB-CT studies prior to each fraction. He was immobilized with a body fix lower extremity mold.  2. the prostate only was boosted using VMAT intensity modulated radiotherapy delivering 6 megavolt photons. Image guidance was performed with CB-CT studies prior to each fraction. He was immobilized with a body fix lower extremity mold.  Narrative: The patient tolerated radiation treatment relatively well.   The patient experienced some minor urinary irritation and modest fatigue.    Plan: The patient has completed radiation treatment. He will return to radiation oncology clinic for routine followup in one month. I advised him to call or return sooner if he has any questions or concerns related to his recovery or treatment. ________________________________  Sheral Apley. Tammi Klippel, M.D.

## 2020-11-05 NOTE — Patient Instructions (Signed)

## 2020-11-17 ENCOUNTER — Other Ambulatory Visit: Payer: Self-pay | Admitting: Medical-Surgical

## 2020-11-17 DIAGNOSIS — I10 Essential (primary) hypertension: Secondary | ICD-10-CM

## 2020-11-21 NOTE — Progress Notes (Signed)
..  The following medication: Eligard was re-approved for Replacement from [employer] [facility]. Approved 11/16/2020 to 10/12/2021. No lapse in coverage, continued care.  Original Expiration of Assistance: 10/12/2020. Reason: Self Pay ID:   Next DOS: 04/21/2021.

## 2020-11-28 NOTE — Progress Notes (Signed)
Patient reports a weak stream. Denies any issues with his bowels.States that his next appointment with the urologist is 12/06/2020.Patient denies any dysuria,hematuria,or straining with urination Patient reports nocturia x3. Patient states that  he empties his bladder with urination.Patient states that he has a straight stream and that he does not have to stop when urinating.Patient reports some leakage and urgency.. Patient IPPS was a 22

## 2020-11-29 ENCOUNTER — Other Ambulatory Visit: Payer: Self-pay

## 2020-11-29 ENCOUNTER — Ambulatory Visit
Admission: RE | Admit: 2020-11-29 | Discharge: 2020-11-29 | Disposition: A | Discharge status: Home / Self Care | Payer: MEDICAID | Source: Ambulatory Visit | Attending: Urology | Admitting: Urology

## 2020-11-29 DIAGNOSIS — C61 Malignant neoplasm of prostate: Secondary | ICD-10-CM

## 2020-11-29 NOTE — Progress Notes (Signed)
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     Interval Since Last Radiation:  4.5 weeks; concurrent with LT-ADT (patient received a 6 month Lupron injection at [facility] on 05/02/20 and another 6 month Lupron injection at [facility] on 11/05/20)   08/28/20-10/30/20: 1. The prostate, seminal vesicles, and pelvic lymph nodes were initially treated to 45 Gy in 25 fractions of 1.8 Gy  2. The prostate only was boosted to 75 Gy with 15 additional fractions of 2.0 Gy   Narrative:  I spoke with the patient to conduct his routine scheduled 1 month follow up visit via telephone to spare the patient unnecessary potential exposure in the healthcare setting during the current COVID-19 pandemic.  The patient was notified in advance and gave permission to proceed with this visit format. He tolerated radiation treatment relatively well with only minor urinary irritation and modest fatigue.    He did report nocturia 3-5 times per night increased urgency and small volume leakage if he postponed voiding for too long.  He continued taking Flomax daily as prescribed.  He did not have any bowel issues and denied any significant fatigue.                           On review of systems, the patient states that he is doing quite well in general.  He reports gradual improvement in his LUTS with a current IPSS score of 12, indicating mild to moderate urinary symptoms only.  He continues with nocturia x3 per night as well as mildly increased urgency and occasional, small volume leakage.  He specifically denies dysuria, gross hematuria, straining to void, incomplete bladder emptying or full on incontinence.  He reports a healthy appetite and is  maintaining his weight.  He denies any abdominal pain, nausea, vomiting, diarrhea or constipation.  He denies any significant fatigue and has continued to tolerate the ADT quite well aside from decreased libido.  He received a 65-month Lupron injection on 05/02/2020 through the [facility] and was noted to have an excellent PSA response, decreased down to 30.60 on 06/11/2020, from 122 in May 2021.  His most recent 63-month Lupron injection was given 11/05/2020 at the cancer center and he will continue this treatment with Dr. Lovena Neighbours going forward.  ALLERGIES:  has No Known Allergies.  Meds: Current Outpatient Medications  Medication Sig Dispense Refill  . glimepiride (AMARYL) 2 MG tablet Take 1 tablet (2 mg total) by mouth 2 (two) times daily. 180 tablet 1  . ibuprofen (ADVIL) 800 MG tablet Take 800 mg by mouth every 8 (eight) hours as needed for headache or moderate pain.    Marland Kitchen losartan (COZAAR) 50 MG tablet Take 2 tablets (100 mg total) by mouth daily. 180 tablet 0  . metFORMIN (GLUCOPHAGE) 1000 MG tablet Take 1 tablet (1,000 mg total) by mouth 2 (two) times daily. 180 tablet 1  . phenazopyridine (PYRIDIUM) 95 MG tablet Take 190 mg by mouth 3 (three) times daily.    . sildenafil (VIAGRA) 50 MG tablet Take 1 tablet by mouth daily as needed.    . tamsulosin (FLOMAX) 0.4 MG CAPS capsule Take  1 capsule (0.4 mg total) by mouth daily. 90 capsule 1  . Multiple Vitamin (MULTIVITAMIN WITH MINERALS) TABS tablet Take 1 tablet by mouth daily. (Patient not taking: Reported on 11/28/2020)     No current facility-administered medications for this encounter.    Physical Findings:  vitals were not taken for this visit.   /Unable to assess due to telephone follow-up visit format.  Lab Findings: Lab Results  Component Value Date   WBC 5.7 08/07/2020   HGB 12.1 (L) 08/07/2020   HCT 36.1 (L) 08/07/2020   MCV 87.2 08/07/2020   PLT 190 08/07/2020     Radiographic Findings: No results  found.  Impression/Plan: 1. 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   He will continue to follow up with urology for ongoing PSA determinations and has an appointment scheduled for lab work on 12/04/2020 and a follow-up visit with Dr. Lovena Neighbours the following week. He understands what to expect with regards to PSA monitoring going forward.  He has continued to tolerate the ADT quite well aside from decreased libido.  He received a 54-month Lupron injection on 05/02/2020 through the [place] and was noted to have an excellent PSA response, decreased down to 30.60 on 06/11/2020, from 122 in May 2021.  His most recent 23-month Lupron injection was given 11/05/2020 at the cancer center and he will continue this treatment with Dr. Lovena Neighbours going forward. I will look forward to following his response to treatment via correspondence with urology, and would be happy to continue to participate in his care if clinically indicated. I talked to the patient about what to expect in the future, including his risk for erectile dysfunction and rectal bleeding. I encouraged him to call or return to the office if he has any questions regarding his previous radiation or possible radiation side effects. He was comfortable with this plan and will follow up as needed.     Nicholos Johns, PA-C

## 2021-01-31 ENCOUNTER — Telehealth: Payer: Self-pay | Admitting: Oncology

## 2021-01-31 NOTE — Telephone Encounter (Signed)
Received a new pt referral from Dr. Lovena Neighbours for ADT management for prostate cancer. Per note from Dr. Lovena Neighbours he wanted the pt to scheduled on/after 05/05/21 for Eligard injection. Mr. Serio has been scheduled to see Dr. Alen Blew on 7/26 at 11am. Letter mailed to the pt and referring office notified.

## 2021-03-04 ENCOUNTER — Other Ambulatory Visit: Payer: Self-pay | Admitting: Medical-Surgical

## 2021-03-10 ENCOUNTER — Other Ambulatory Visit: Payer: Self-pay | Admitting: Medical-Surgical

## 2021-03-25 ENCOUNTER — Other Ambulatory Visit: Payer: Self-pay | Admitting: Medical-Surgical

## 2021-04-24 ENCOUNTER — Other Ambulatory Visit: Payer: Self-pay

## 2021-04-24 ENCOUNTER — Encounter: Payer: Self-pay | Admitting: Medical-Surgical

## 2021-04-24 ENCOUNTER — Ambulatory Visit (INDEPENDENT_AMBULATORY_CARE_PROVIDER_SITE_OTHER): Payer: Self-pay | Admitting: Medical-Surgical

## 2021-04-24 VITALS — BP 152/70 | HR 82 | Temp 99.0°F | Ht 68.0 in | Wt 195.0 lb

## 2021-04-24 DIAGNOSIS — I10 Essential (primary) hypertension: Secondary | ICD-10-CM

## 2021-04-24 DIAGNOSIS — E1165 Type 2 diabetes mellitus with hyperglycemia: Secondary | ICD-10-CM

## 2021-04-24 LAB — POCT GLYCOSYLATED HEMOGLOBIN (HGB A1C): Hemoglobin A1C: 5.9 % — AB (ref 4.0–5.6)

## 2021-04-24 MED ORDER — SILDENAFIL CITRATE 100 MG PO TABS: 100.0000 mg | ORAL_TABLET | ORAL | 1 refills | Status: DC | PRN

## 2021-04-24 MED ORDER — LOSARTAN POTASSIUM 100 MG PO TABS: 100.0000 mg | ORAL_TABLET | Freq: Every day | ORAL | 1 refills | Status: DC

## 2021-04-24 MED ORDER — METFORMIN HCL 1000 MG PO TABS: 1000.0000 mg | ORAL_TABLET | Freq: Two times a day (BID) | ORAL | 1 refills | Status: DC

## 2021-04-24 MED ORDER — TAMSULOSIN HCL 0.4 MG PO CAPS: 0.4000 mg | ORAL_CAPSULE | Freq: Every day | ORAL | 1 refills | Status: DC

## 2021-04-24 MED ORDER — AMLODIPINE BESYLATE 2.5 MG PO TABS: 2.5000 mg | ORAL_TABLET | Freq: Every day | ORAL | 1 refills | Status: DC

## 2021-04-24 MED ORDER — GLIMEPIRIDE 2 MG PO TABS: 2.0000 mg | ORAL_TABLET | Freq: Two times a day (BID) | ORAL | 1 refills | Status: DC

## 2021-04-24 NOTE — Progress Notes (Signed)
Subjective:    CC: Diabetes/hypertension follow-up  HPI: Pleasant 75 year old male accompanied by his granddaughter presenting today for diabetes and hypertension follow-up.  Diabetes-checking his blood sugar regularly and keeps a log in a small book.  Most of his readings have been well within the normal limit.  He does follow diabetic diet and takes metformin and glimepiride as prescribed.  Tolerates both medications well without side effects.  Notes he needs a refill on all of his medications today.  Would like a referral to an eye doctor.  Hypertension also checking his blood pressure and keeping readings in the log.  Blood pressure has been elevated lately.  His lowest systolic was in the 1 teens but most of his readings are 140s to 160s over 80s.  He is taking losartan 100 mg daily, tolerating well without side effects. Denies CP, SOB, palpitations, lower extremity edema, dizziness, headaches, or vision changes.  I reviewed the past medical history, family history, social history, surgical history, and allergies today and no changes were needed.  Please see the problem list section below in epic for further details.  Past Medical History: Past Medical History:  Diagnosis Date   Diabetes mellitus without complication ([place])    Hypertension    Prostate cancer [facility])    Past Surgical History: Past Surgical History:  Procedure Laterality Date   GOLD SEED IMPLANT N/A 08/15/2020   Procedure: GOLD SEED IMPLANT;  Surgeon: Ceasar Mons, MD;  Location: [facility];  Service: Urology;  Laterality: N/A;   SPACE OAR INSTILLATION N/A 08/15/2020   Procedure: SPACE OAR INSTILLATION;  Surgeon: Ceasar Mons, MD;  Location: [facility];  Service: Urology;  Laterality: N/A;   Social History: Social History   Socioeconomic History   Marital status: Married    Spouse name: Not on file   Number of children: 4   Years of education: Not on file   Highest education level: Not on file   Occupational History   Not on file  Tobacco Use   Smoking status: Never   Smokeless tobacco: Never  Vaping Use   Vaping Use: Never used  Substance and Sexual Activity   Alcohol use: No   Drug use: No   Sexual activity: Not Currently    Partners: Female  Other Topics Concern   Not on file  Social History Narrative   Not on file   Social Determinants of Health   Financial Resource Strain: Not on file  Food Insecurity: Not on file  Transportation Needs: Not on file  Physical Activity: Not on file  Stress: Not on file  Social Connections: Not on file   Family History: Family History  Problem Relation Age of Onset   Prostate cancer Brother    Breast cancer Neg Hx    Colon cancer Neg Hx    Pancreatic cancer Neg Hx    Allergies: No Known Allergies Medications: See med rec.  Review of Systems: See HPI for pertinent positives and negatives.   Objective:    General: Well Developed, well nourished, and in no acute distress.  Neuro: Alert and oriented x3.  HEENT: Normocephalic, atraumatic.  Skin: Warm and dry. Cardiac: Regular rate and rhythm, no murmurs rubs or gallops, no lower extremity edema.  Respiratory: Clear to auscultation bilaterally. Not using accessory muscles, speaking in full sentences.   Impression and Recommendations:    1. Type 2 diabetes mellitus with hyperglycemia, without long-term current use of insulin (HCC) POCT hemoglobin A1c 5.9% today.  Glucose well  under control.  Continue metformin 1000 mg twice daily and limit bread 2 mg twice daily as prescribed.  Referring to ophthalmology per patient request.  Diabetic foot exam completed. - POCT glycosylated hemoglobin (Hb A1C) - Ambulatory referral to Ophthalmology  2. Essential hypertension Blood pressure has been elevated above goal despite treatment with losartan.  Continue losartan 100 mg daily.  Adding amlodipine 2.5 mg daily.  Recommend continuing to monitor blood pressure on a regular basis and  keeping a log with a goal of 130/80 or less.  Since [place] be [place] for the next 3 [place], I will reach out in 2 weeks to see how his home blood pressures are running. - CBC with Differential/Platelet - COMPLETE METABOLIC PANEL WITH GFR  Return in about 6 months (around 10/25/2021) for DM/HTN/HLD follow up. ___________________________________________ Clearnce Sorrel, DNP, APRN, FNP-BC [facility] and [place]

## 2021-04-25 LAB — CBC WITH DIFFERENTIAL/PLATELET
Absolute Monocytes: 271 cells/uL (ref 200–950)
Basophils Absolute: 10 cells/uL (ref 0–200)
Basophils Relative: 0.3 %
Eosinophils Absolute: 10 cells/uL — ABNORMAL LOW (ref 15–500)
Eosinophils Relative: 0.3 %
HCT: 33.6 % — ABNORMAL LOW (ref 38.5–50.0)
Hemoglobin: 10.6 g/dL — ABNORMAL LOW (ref 13.2–17.1)
Lymphs Abs: 855 cells/uL (ref 850–3900)
MCH: 28.6 pg (ref 27.0–33.0)
MCHC: 31.5 g/dL — ABNORMAL LOW (ref 32.0–36.0)
MCV: 90.8 fL (ref 80.0–100.0)
MPV: 10.7 fL (ref 7.5–12.5)
Monocytes Relative: 8.2 %
Neutro Abs: 2155 cells/uL (ref 1500–7800)
Neutrophils Relative %: 65.3 %
Platelets: 179 10*3/uL (ref 140–400)
RBC: 3.7 10*6/uL — ABNORMAL LOW (ref 4.20–5.80)
RDW: 14.4 % (ref 11.0–15.0)
Total Lymphocyte: 25.9 %
WBC: 3.3 10*3/uL — ABNORMAL LOW (ref 3.8–10.8)

## 2021-04-25 LAB — COMPLETE METABOLIC PANEL WITH GFR
AG Ratio: 1.5 (calc) (ref 1.0–2.5)
ALT: 17 U/L (ref 9–46)
AST: 17 U/L (ref 10–35)
Albumin: 4.6 g/dL (ref 3.6–5.1)
Alkaline phosphatase (APISO): 57 U/L (ref 35–144)
BUN/Creatinine Ratio: 11 (calc) (ref 6–22)
BUN: 14 mg/dL (ref 7–25)
CO2: 29 mmol/L (ref 20–32)
Calcium: 10.6 mg/dL — ABNORMAL HIGH (ref 8.6–10.3)
Chloride: 104 mmol/L (ref 98–110)
Creat: 1.29 mg/dL — ABNORMAL HIGH (ref 0.70–1.28)
Globulin: 3.1 g/dL (calc) (ref 1.9–3.7)
Glucose, Bld: 54 mg/dL — ABNORMAL LOW (ref 65–99)
Potassium: 4.8 mmol/L (ref 3.5–5.3)
Sodium: 141 mmol/L (ref 135–146)
Total Bilirubin: 0.4 mg/dL (ref 0.2–1.2)
Total Protein: 7.7 g/dL (ref 6.1–8.1)
eGFR: 58 mL/min/{1.73_m2} — ABNORMAL LOW (ref >=60)

## 2021-05-01 LAB — HM DIABETES EYE EXAM

## 2021-05-03 ENCOUNTER — Other Ambulatory Visit: Payer: Self-pay

## 2021-05-03 ENCOUNTER — Inpatient Hospital Stay: Payer: Self-pay | Attending: Oncology | Admitting: Oncology

## 2021-05-03 VITALS — BP 189/83 | HR 92 | Temp 98.3°F | Resp 13 | Ht 68.0 in | Wt 195.9 lb

## 2021-05-03 DIAGNOSIS — Z79899 Other long term (current) drug therapy: Secondary | ICD-10-CM | POA: Insufficient documentation

## 2021-05-03 DIAGNOSIS — C61 Malignant neoplasm of prostate: Secondary | ICD-10-CM | POA: Insufficient documentation

## 2021-05-03 NOTE — Progress Notes (Signed)
Reason for the request:    Prostate cancer  HPI: I was asked by Dr. Lovena Neighbours to evaluate Caleb Robbins for the evaluation of prostate cancer.  He is a 75 year old man with history of diabetes and hypertension and was diagnosed with prostate cancer in 2019.  At that time he presented with Gleason score 4+4 = 8 and PSA of 122.  He did not have any active disease and received definitive therapy with radiation started in November 2021.  He received 45 Gray in 25 fractions to the prostate seminal vesicles and lymph nodes and boosted up to 75 Gray total.  Therapy concluded in January 2022.  He was also receiving androgen deprivation therapy every 6 months started in July 2021.  His PSA declined to 3.46 in February 2022.  He has been following with Dr. Lovena Neighbours for PSA monitoring.  Clinically, he reports feeling well without any major complaints.  He has tolerated androgen deprivation therapy without any complications.  He has any hot flashes, weakness or fatigue.  He does not report any headaches, blurry vision, syncope or seizures. Does not report any fevers, chills or sweats.  Does not report any cough, wheezing or hemoptysis.  Does not report any chest pain, palpitation, orthopnea or leg edema.  Does not report any nausea, vomiting or abdominal pain.  Does not report any constipation or diarrhea.  Does not report any skeletal complaints.    Does not report frequency, urgency or hematuria.  Does not report any skin rashes or lesions. Does not report any heat or cold intolerance.  Does not report any lymphadenopathy or petechiae.  Does not report any anxiety or depression.  Remaining review of systems is negative.     Past Medical History:  Diagnosis Date   Diabetes mellitus without complication ([place])    Hypertension    Prostate cancer ([place])   :   Past Surgical History:  Procedure Laterality Date   GOLD SEED IMPLANT N/A 08/15/2020   Procedure: GOLD SEED IMPLANT;  Surgeon: Ceasar Mons, MD;   Location: [facility];  Service: Urology;  Laterality: N/A;   SPACE OAR INSTILLATION N/A 08/15/2020   Procedure: SPACE OAR INSTILLATION;  Surgeon: Ceasar Mons, MD;  Location: [facility];  Service: Urology;  Laterality: N/A;  :   Current Outpatient Medications:    amLODipine (NORVASC) 2.5 MG tablet, Take 1 tablet (2.5 mg total) by mouth daily., Disp: 90 tablet, Rfl: 1   glimepiride (AMARYL) 2 MG tablet, Take 1 tablet (2 mg total) by mouth 2 (two) times daily., Disp: 180 tablet, Rfl: 1   ibuprofen (ADVIL) 800 MG tablet, Take 800 mg by mouth every 8 (eight) hours as needed for headache or moderate pain., Disp: , Rfl:    losartan (COZAAR) 100 MG tablet, Take 1 tablet (100 mg total) by mouth daily., Disp: 90 tablet, Rfl: 1   metFORMIN (GLUCOPHAGE) 1000 MG tablet, Take 1 tablet (1,000 mg total) by mouth 2 (two) times daily., Disp: 180 tablet, Rfl: 1   Multiple Vitamin (MULTIVITAMIN WITH MINERALS) TABS tablet, Take 1 tablet by mouth daily., Disp: , Rfl:    phenazopyridine (PYRIDIUM) 95 MG tablet, Take 190 mg by mouth 3 (three) times daily., Disp: , Rfl:    sildenafil (VIAGRA) 100 MG tablet, Take 1 tablet (100 mg total) by mouth as needed., Disp: 10 tablet, Rfl: 1   tamsulosin (FLOMAX) 0.4 MG CAPS capsule, Take 1 capsule (0.4 mg total) by mouth daily., Disp: 90 capsule, Rfl: 1:  No Known Allergies:  Family History  Problem Relation Age of Onset   Prostate cancer Brother    Breast cancer Neg Hx    Colon cancer Neg Hx    Pancreatic cancer Neg Hx   :   Social History   Socioeconomic History   Marital status: Married    Spouse name: Not on file   Number of children: 4   Years of education: Not on file   Highest education level: Not on file  Occupational History   Not on file  Tobacco Use   Smoking status: Never   Smokeless tobacco: Never  Vaping Use   Vaping Use: Never used  Substance and Sexual Activity   Alcohol use: No   Drug use: No   Sexual activity: Not Currently     Partners: Female  Other Topics Concern   Not on file  Social History Narrative   Not on file   Social Determinants of Health   Financial Resource Strain: Not on file  Food Insecurity: Not on file  Transportation Needs: Not on file  Physical Activity: Not on file  Stress: Not on file  Social Connections: Not on file  Intimate Partner Violence: Not on file  :  Pertinent items are noted in HPI.  Exam: Blood pressure (!) 189/83, pulse 92, temperature 98.3 F (36.8 C), temperature source Oral, resp. rate 13, height '5\' 8"'$  (1.727 m), weight 195 lb 14.4 oz (88.9 kg), SpO2 100 %. ECOG 0 General appearance: alert and cooperative appeared without distress. Head: atraumatic without any abnormalities. Eyes: conjunctivae/corneas clear. PERRL.  Sclera anicteric. Throat: lips, mucosa, and tongue normal; without oral thrush or ulcers. Resp: clear to auscultation bilaterally without rhonchi, wheezes or dullness to percussion. Cardio: regular rate and rhythm, S1, S2 normal, no murmur, click, rub or gallop GI: soft, non-tender; bowel sounds normal; no masses,  no organomegaly Skin: Skin color, texture, turgor normal. No rashes or lesions Lymph nodes: Cervical, supraclavicular, and axillary nodes normal. Neurologic: Grossly normal without any motor, sensory or deep tendon reflexes. Musculoskeletal: No joint deformity or effusion.    Assessment and Plan:   75 year old man with:  1.  Prostate cancer diagnosed in 2019 and subsequently received treatment in 2021 after presenting with a PSA of 122 and a Gleason score 4+4 = 8 and localized disease.  He received definitive therapy with radiation and androgen deprivation therapy.  The natural course of this disease was reviewed at this time and treatment options were discussed.  The role for androgen receptor pathway inhibitors, systemic chemotherapy among other options were discussed.  At this time these options will be deferred unless he has relapsed  disease in the future.  2.  Androgen deprivation therapy: Risks and benefits of long-term androgen deprivation were discussed.  Complications that include hot flashes, weight gain osteoporosis were discussed.  I recommended continuing this to complete 2 years of therapy.  Longer duration will be considered if he develops metastatic disease.  Next injection will be scheduled in the near future and repeated every 6 months.  3.  Follow-up: We will be in 6 months for repeat evaluation  45  minutes were dedicated to this visit. The time was spent on reviewing laboratory data, imaging studies, discussing treatment options,  and answering questions regarding future plan.     A copy of this consult has been forwarded to the requesting physician.

## 2021-05-06 ENCOUNTER — Other Ambulatory Visit: Payer: Self-pay

## 2021-05-06 ENCOUNTER — Inpatient Hospital Stay: Payer: Self-pay

## 2021-05-06 VITALS — BP 164/62 | HR 78 | Temp 98.9°F | Resp 16

## 2021-05-06 DIAGNOSIS — C61 Malignant neoplasm of prostate: Secondary | ICD-10-CM

## 2021-05-06 MED ORDER — LEUPROLIDE ACETATE (6 MONTH) 45 MG ~~LOC~~ KIT
45.0000 mg | PACK | Freq: Once | SUBCUTANEOUS | Status: AC
  Administered 2021-05-06: 45 mg via SUBCUTANEOUS
  Filled 2021-05-06: qty 45

## 2021-05-06 NOTE — Patient Instructions (Signed)
Leuprolide depot injection What is this medication? LEUPROLIDE (loo PROE lide) is a man-made protein that acts like a natural hormone in the body. It decreases testosterone in men and decreases estrogen in women. In men, this medicine is used to treat advanced prostate cancer. In women, some forms of this medicine may be used to treat endometriosis, uterinefibroids, or other male hormone-related problems. This medicine may be used for other purposes; ask your health care provider orpharmacist if you have questions. COMMON BRAND NAME(S): Eligard, Fensolv, Lupron Depot, Lupron Depot-Ped, Viadur What should I tell my care team before I take this medication? They need to know if you have any of these conditions: diabetes heart disease or previous heart attack high blood pressure high cholesterol mental illness osteoporosis pain or difficulty passing urine seizures spinal cord metastasis stroke suicidal thoughts, plans, or attempt; a previous suicide attempt by you or a family member tobacco smoker unusual vaginal bleeding (women) an unusual or allergic reaction to leuprolide, benzyl alcohol, other medicines, foods, dyes, or preservatives pregnant or trying to get pregnant breast-feeding How should I use this medication? This medicine is for injection into a muscle or for injection under the skin. It is given by a health care professional in a hospital or clinic setting. The specific product will determine how it will be given to you. Make sure youunderstand which product you receive and how often you will receive it. Talk to your pediatrician regarding the use of this medicine in children.Special care may be needed. Overdosage: If you think you have taken too much of this medicine contact apoison control center or emergency room at once. NOTE: This medicine is only for you. Do not share this medicine with others. What if I miss a dose? It is important not to miss a dose. Call your doctor  or health careprofessional if you are unable to keep an appointment. Depot injections: Depot injections are given either once-monthly, every 12 weeks, every 16 weeks, or every 24 weeks depending on the product you are prescribed. The product you are prescribed will be based on if you are male orfemale, and your condition. Make sure you understand your product and dosing. What may interact with this medication? Do not take this medicine with any of the following medications: chasteberry cisapride dronedarone pimozide thioridazine This medicine may also interact with the following medications: herbal or dietary supplements, like black cohosh or DHEA male hormones, like estrogens or progestins and birth control pills, patches, rings, or injections male hormones, like testosterone other medicines that prolong the QT interval (abnormal heart rhythm) This list may not describe all possible interactions. Give your health care provider a list of all the medicines, herbs, non-prescription drugs, or dietary supplements you use. Also tell them if you smoke, drink alcohol, or use illegaldrugs. Some items may interact with your medicine. What should I watch for while using this medication? Visit your doctor or health care professional for regular checks on your progress. During the first weeks of treatment, your symptoms may get worse, but then will improve as you continue your treatment. You may get hot flashes, increased bone pain, increased difficulty passing urine, or an aggravation of nerve symptoms. Discuss these effects with your doctor or health careprofessional, some of them may improve with continued use of this medicine. Male patients may experience a menstrual cycle or spotting during the first months of therapy with this medicine. If this continues, contact your doctor orhealth care professional. This medicine may increase blood sugar. Ask  your healthcare provider if changesin diet or medicines  are needed if you have diabetes. What side effects may I notice from receiving this medication? Side effects that you should report to your doctor or health care professionalas soon as possible: allergic reactions like skin rash, itching or hives, swelling of the face, lips, or tongue breathing problems chest pain depression or memory disorders pain in your legs or groin pain at site where injected or implanted seizures severe headache signs and symptoms of high blood sugar such as being more thirsty or hungry or having to urinate more than normal. You may also feel very tired or have blurry vision swelling of the feet and legs suicidal thoughts or other mood changes visual changes vomiting Side effects that usually do not require medical attention (report to yourdoctor or health care professional if they continue or are bothersome): breast swelling or tenderness decrease in sex drive or performance diarrhea hot flashes loss of appetite muscle, joint, or bone pains nausea redness or irritation at site where injected or implanted skin problems or acne This list may not describe all possible side effects. Call your doctor for medical advice about side effects. You may report side effects to FDA at1-800-FDA-1088. Where should I keep my medication? This drug is given in a hospital or clinic and will not be stored at home. NOTE: This sheet is a summary. It may not cover all possible information. If you have questions about this medicine, talk to your doctor, pharmacist, orhealth care provider.  2022 Elsevier/Gold Standard (2019-08-31 10:35:13)

## 2021-05-07 ENCOUNTER — Ambulatory Visit: Payer: Self-pay | Admitting: Oncology

## 2021-05-23 ENCOUNTER — Telehealth: Payer: Self-pay

## 2021-05-23 NOTE — Telephone Encounter (Signed)
Blood pressure readings  137/74 129/67 130/58 139/57 145/73

## 2021-05-23 NOTE — Telephone Encounter (Signed)
-----   Message from Samuel Bouche, NP sent at 05/08/2021  7:10 AM EDT ----- Please contact Caleb Robbins to see how his blood pressures are running. We made some changes but he is  out of town for 3 months and cannot come in for [place] for BP check. He keeps a log of his BP and sugars on a daily basis.  ----- Message ----- From: Samuel Bouche, NP Sent: 05/08/2021  12:00 AM EDT To: Samuel Bouche, NP  Check on BP at home, patient out of town for 3 months

## 2021-05-24 NOTE — Telephone Encounter (Signed)
Spoke with pt's daughter Alma Friendly and informed her of recommendations per Samuel Bouche.  Daughter expressed understanding and is agreeable.  She will relay this information to her father.  Charyl Bigger, CMA

## 2021-07-26 ENCOUNTER — Encounter: Payer: Self-pay | Admitting: Medical-Surgical

## 2021-07-26 ENCOUNTER — Ambulatory Visit (INDEPENDENT_AMBULATORY_CARE_PROVIDER_SITE_OTHER): Payer: Self-pay | Admitting: Medical-Surgical

## 2021-07-26 VITALS — BP 154/84 | HR 99 | Resp 20 | Ht 68.0 in | Wt 199.0 lb

## 2021-07-26 DIAGNOSIS — D649 Anemia, unspecified: Secondary | ICD-10-CM

## 2021-07-26 DIAGNOSIS — E1169 Type 2 diabetes mellitus with other specified complication: Secondary | ICD-10-CM

## 2021-07-26 DIAGNOSIS — I10 Essential (primary) hypertension: Secondary | ICD-10-CM

## 2021-07-26 DIAGNOSIS — R399 Unspecified symptoms and signs involving the genitourinary system: Secondary | ICD-10-CM

## 2021-07-26 DIAGNOSIS — E1165 Type 2 diabetes mellitus with hyperglycemia: Secondary | ICD-10-CM

## 2021-07-26 DIAGNOSIS — E785 Hyperlipidemia, unspecified: Secondary | ICD-10-CM

## 2021-07-26 DIAGNOSIS — N289 Disorder of kidney and ureter, unspecified: Secondary | ICD-10-CM

## 2021-07-26 DIAGNOSIS — N529 Male erectile dysfunction, unspecified: Secondary | ICD-10-CM

## 2021-07-26 LAB — POCT URINALYSIS DIP (CLINITEK)
Bilirubin, UA: NEGATIVE
Blood, UA: NEGATIVE
Glucose, UA: NEGATIVE mg/dL
Ketones, POC UA: NEGATIVE mg/dL
Leukocytes, UA: NEGATIVE
Nitrite, UA: NEGATIVE
POC PROTEIN,UA: NEGATIVE
Spec Grav, UA: 1.015 (ref 1.010–1.025)
Urobilinogen, UA: 0.2 E.U./dL
pH, UA: 6.5 (ref 5.0–8.0)

## 2021-07-26 MED ORDER — AMLODIPINE BESYLATE 2.5 MG PO TABS: 2.5000 mg | ORAL_TABLET | Freq: Every day | ORAL | 1 refills | Status: DC

## 2021-07-26 MED ORDER — SILDENAFIL CITRATE 100 MG PO TABS: 100.0000 mg | ORAL_TABLET | ORAL | 1 refills | Status: DC | PRN

## 2021-07-26 NOTE — Progress Notes (Signed)
  HPI with pertinent ROS:   CC: anemia, kidney function, HTN follow up  HPI: Pleasant 75 year old male presenting today for the following:   Professional interpreter used via speakerphone: Philippa Chester, [employer] interpreter  At our last check, he was found to have a low hemoglobin with slightly reduced kidney function. He has been maintaining his BP and staying hydrated. Asymptomatic. Needs labs today.   HTN- taking Amlodipine and Losartan as prescribed, tolerating well without side effects. Home BP log with readings 120-130s/70-80s. Denies CP, SOB, palpitations, lower extremity edema, dizziness, headaches, or vision changes.  Having difficulty with ED. Uses Viagra 100mg  daily prn with some benefit. Would like a refill.   Having some difficulty with urination for the last 2 months. Now experiencing  urgency with near urge incontinence if unable to get to a bathroom quickly. Also has periods where he feels like he needs to go badly and only a little urine comes out. No fevers, chills, abdominal pain, urinary odor, burning, or penile discharge noted.   I reviewed the past medical history, family history, social history, surgical history, and allergies today and no changes were needed.  Please see the problem list section below in epic for further details.   Physical exam:   General: Well Developed, well nourished, and in no acute distress.  Neuro: Alert and oriented x3.  HEENT: Normocephalic, atraumatic.  Skin: Warm and dry. Cardiac: Regular rate and rhythm, no murmurs rubs or gallops, no lower extremity edema.  Respiratory: Clear to auscultation bilaterally. Not using accessory muscles, speaking in full sentences.  Impression and Recommendations:    1. Normocytic anemia Checking CBC w/diff. Suspect this is anemia of chronic disease related.  - CBC with Differential/Platelet  2. Abnormal kidney function Checking CMP.  - COMPLETE METABOLIC PANEL WITH GFR  3. Essential hypertension Checking  lipid panel. BP slightly elevated here but home readings at goal. Continue Amlodipine and Losartan as prescribed.  - Lipid panel  4. Lower urinary tract symptoms (LUTS) POCT UA negative. With known prostate cancer, recommend contacting his oncologist regarding his symptoms.  - POCT URINALYSIS DIP (CLINITEK)  5. Type 2 diabetes mellitus with hyperglycemia, without long-term current use of insulin (HCC) Checking lipids. DM well controlled on current regimen with home readings all under 120.  - Lipid panel  6. Erectile dysfunction Continue Viagra 100mg  daily prn. Refill sent.   Return in about 3 months (around 10/26/2021) for DM/HTN/HLD follow up. ___________________________________________ Clearnce Sorrel, DNP, APRN, FNP-BC [facility] [place]

## 2021-07-27 LAB — COMPLETE METABOLIC PANEL WITH GFR
AG Ratio: 1.5 (calc) (ref 1.0–2.5)
ALT: 18 U/L (ref 9–46)
AST: 18 U/L (ref 10–35)
Albumin: 4.5 g/dL (ref 3.6–5.1)
Alkaline phosphatase (APISO): 61 U/L (ref 35–144)
BUN/Creatinine Ratio: 10 (calc) (ref 6–22)
BUN: 14 mg/dL (ref 7–25)
CO2: 29 mmol/L (ref 20–32)
Calcium: 10.9 mg/dL — ABNORMAL HIGH (ref 8.6–10.3)
Chloride: 102 mmol/L (ref 98–110)
Creat: 1.38 mg/dL — ABNORMAL HIGH (ref 0.70–1.28)
Globulin: 3.1 g/dL (calc) (ref 1.9–3.7)
Glucose, Bld: 115 mg/dL — ABNORMAL HIGH (ref 65–99)
Potassium: 4.7 mmol/L (ref 3.5–5.3)
Sodium: 141 mmol/L (ref 135–146)
Total Bilirubin: 0.3 mg/dL (ref 0.2–1.2)
Total Protein: 7.6 g/dL (ref 6.1–8.1)
eGFR: 53 mL/min/{1.73_m2} — ABNORMAL LOW (ref >=60)

## 2021-07-27 LAB — LIPID PANEL
Cholesterol: 199 mg/dL (ref <200)
HDL: 63 mg/dL (ref >=40)
LDL Cholesterol (Calc): 107 mg/dL (calc) — ABNORMAL HIGH
Non-HDL Cholesterol (Calc): 136 mg/dL (calc) — ABNORMAL HIGH (ref <130)
Total CHOL/HDL Ratio: 3.2 (calc) (ref <5.0)
Triglycerides: 170 mg/dL — ABNORMAL HIGH (ref <150)

## 2021-07-27 LAB — CBC WITH DIFFERENTIAL/PLATELET
Absolute Monocytes: 299 cells/uL (ref 200–950)
Basophils Absolute: 9 cells/uL (ref 0–200)
Basophils Relative: 0.2 %
Eosinophils Absolute: 41 cells/uL (ref 15–500)
Eosinophils Relative: 0.9 %
HCT: 33.7 % — ABNORMAL LOW (ref 38.5–50.0)
Hemoglobin: 10.6 g/dL — ABNORMAL LOW (ref 13.2–17.1)
Lymphs Abs: 708 cells/uL — ABNORMAL LOW (ref 850–3900)
MCH: 28.4 pg (ref 27.0–33.0)
MCHC: 31.5 g/dL — ABNORMAL LOW (ref 32.0–36.0)
MCV: 90.3 fL (ref 80.0–100.0)
MPV: 10.9 fL (ref 7.5–12.5)
Monocytes Relative: 6.5 %
Neutro Abs: 3542 cells/uL (ref 1500–7800)
Neutrophils Relative %: 77 %
Platelets: 172 10*3/uL (ref 140–400)
RBC: 3.73 10*6/uL — ABNORMAL LOW (ref 4.20–5.80)
RDW: 14.4 % (ref 11.0–15.0)
Total Lymphocyte: 15.4 %
WBC: 4.6 10*3/uL (ref 3.8–10.8)

## 2021-07-30 ENCOUNTER — Other Ambulatory Visit: Payer: Self-pay

## 2021-07-30 MED ORDER — ATORVASTATIN CALCIUM 20 MG PO TABS: 20.0000 mg | ORAL_TABLET | Freq: Every day | ORAL | 3 refills | Status: DC

## 2021-07-30 MED ORDER — PHENAZOPYRIDINE HCL 95 MG PO TABS: 190.0000 mg | ORAL_TABLET | Freq: Three times a day (TID) | ORAL | 0 refills | Status: DC

## 2021-07-30 NOTE — Addendum Note (Signed)
Addended bySamuel Bouche on: 07/30/2021 09:55 PM   Modules accepted: Orders

## 2021-08-07 IMAGING — NM NM BONE WHOLE BODY
2 series · 2 of 2 positions shown · non-contrast
Comparison: Whole-body bone scan 08/20/2018. Abdominopelvic CT
03/23/2020.

CLINICAL DATA: Prostate cancer with elevated serum PSA level. No
acute injury or bone pain.

EXAM:
NUCLEAR MEDICINE WHOLE BODY BONE SCAN
TECHNIQUE: Whole body anterior and posterior images were obtained approximately
3 hours after intravenous injection of radiopharmaceutical.
RADIOPHARMACEUTICALS:  22.0 mCi 1echnetium-RRm MDP IV

[Series 1: wbr_bone_40 whole body · 2.66mm/px · 1 of 1 slices shown (1 of 2)]
[im 1/1]
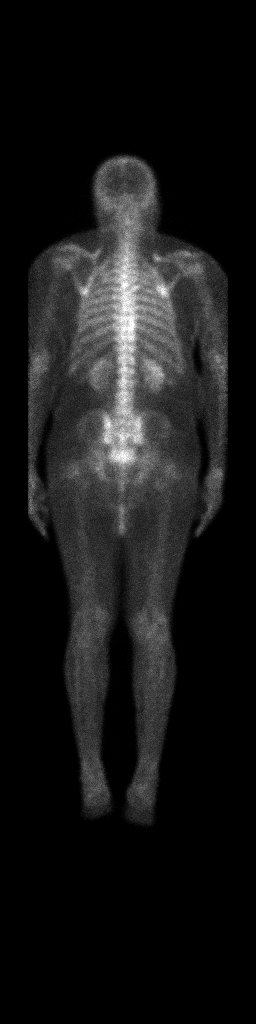

[Series 1: wbr_bone_40 whole body · 2.66mm/px · 1 of 1 slices shown (2 of 2)]
[im 1/1]
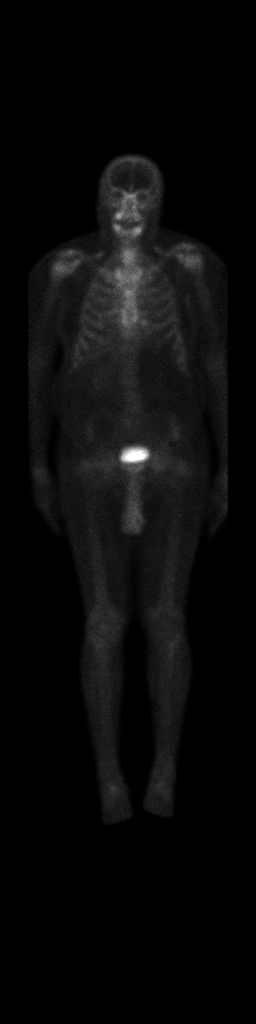

[2 of 2 positions shown; findings below may reference images not displayed]

FINDINGS: There is no osseous uptake suspicious for metastatic disease.
Minimal degenerative uptake is present within the spine, similar to
previous study. The soft tissue activity appears normal.
IMPRESSION: Stable examination.  No evidence of osseous metastatic disease.

## 2021-08-07 IMAGING — CT CT ABD-PELV W/ CM
2 of 5 series · 17 of 46 positions shown, 19 images · IV contrast (omnipaque)
Comparison: 08/20/2018 CT abdomen/pelvis.

CLINICAL DATA: Prostate cancer.  Restaging.  PSA 122 on 03/07/2020.

EXAM:
CT ABDOMEN AND PELVIS WITH CONTRAST
TECHNIQUE: Multidetector CT imaging of the abdomen and pelvis was performed
using the standard protocol following bolus administration of
intravenous contrast.
CONTRAST:  100mL OMNIPAQUE IOHEXOL 300 MG/ML  SOLN

[Series 2: axial st · axial · 0.78mm/px · z∈[-412,-62]mm · 14 of 82 slices shown, 16 images]
[im 6/82  soft-tissue]
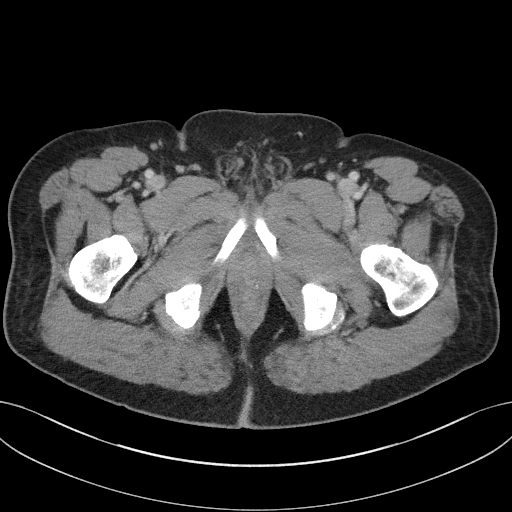
[im 6/82  bone]
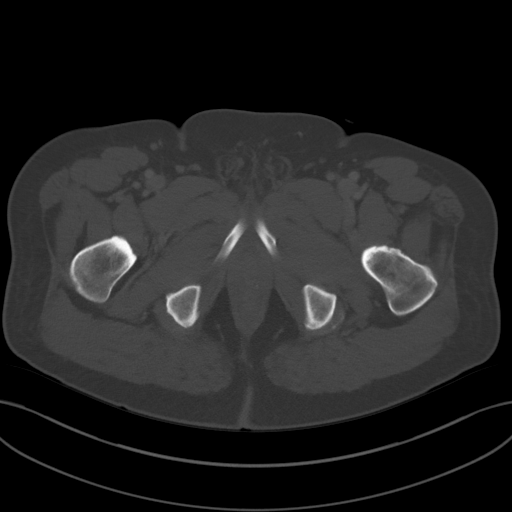
[im 11/82  soft-tissue]
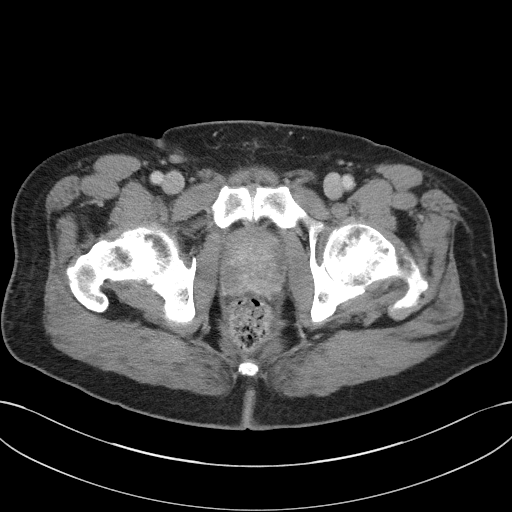
[im 17/82  soft-tissue]
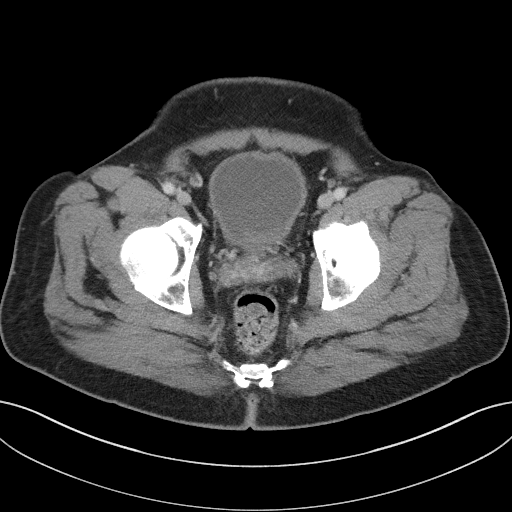
[im 22/82  soft-tissue]
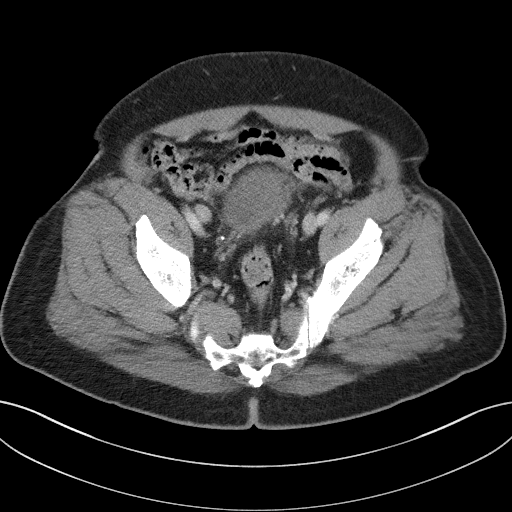
[im 28/82  soft-tissue]
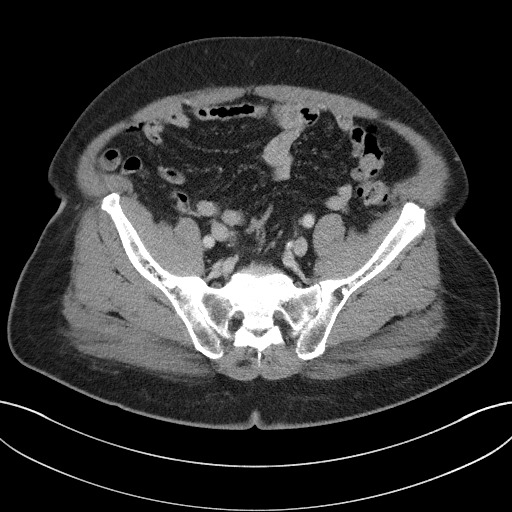
[im 33/82  soft-tissue]
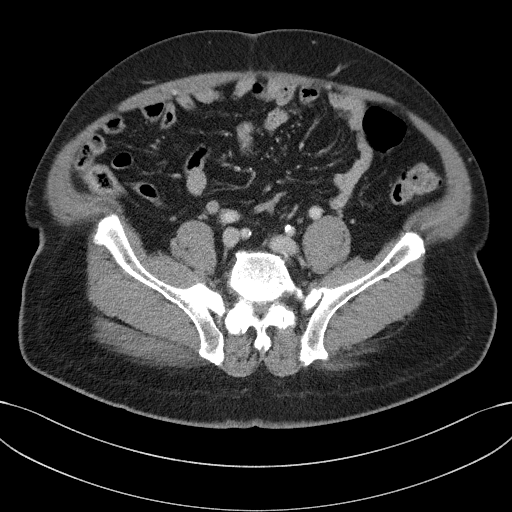
[im 38/82  soft-tissue]
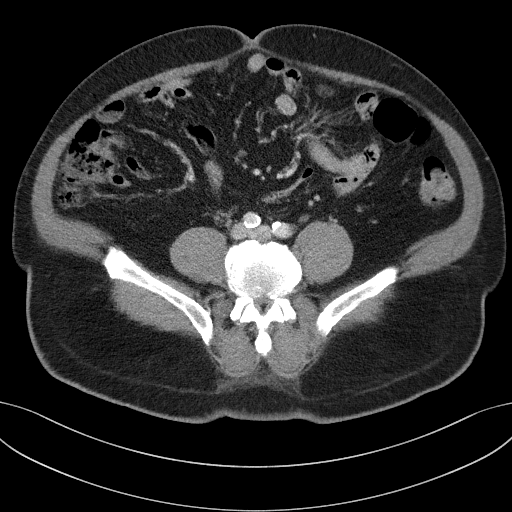
[im 44/82  soft-tissue]
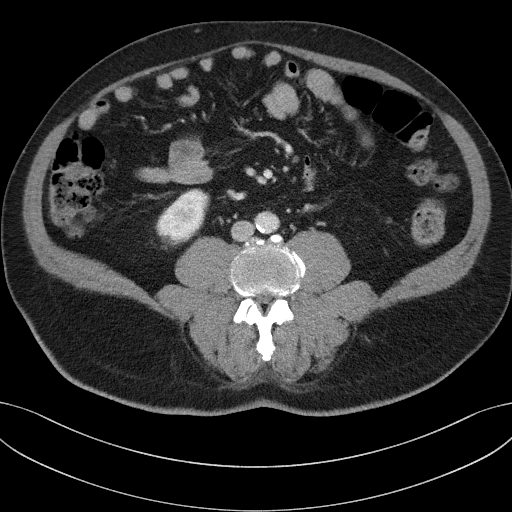
[im 49/82  soft-tissue]
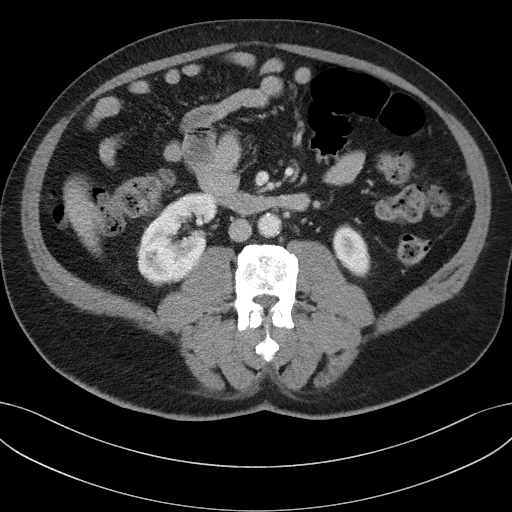
[im 49/82  bone]
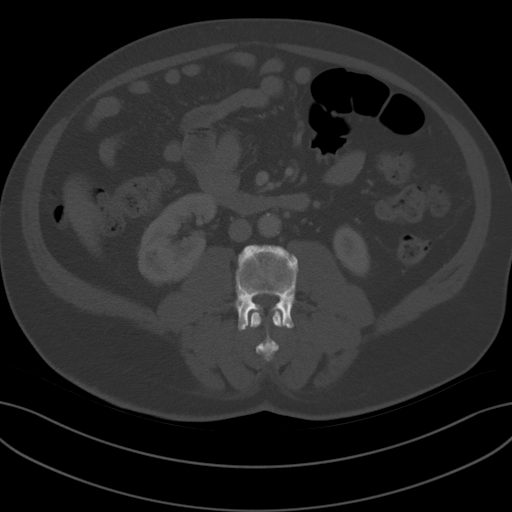
[im 55/82  soft-tissue]
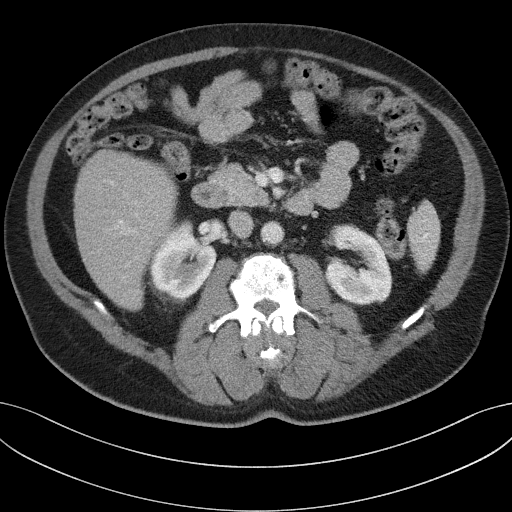
[im 60/82  soft-tissue]
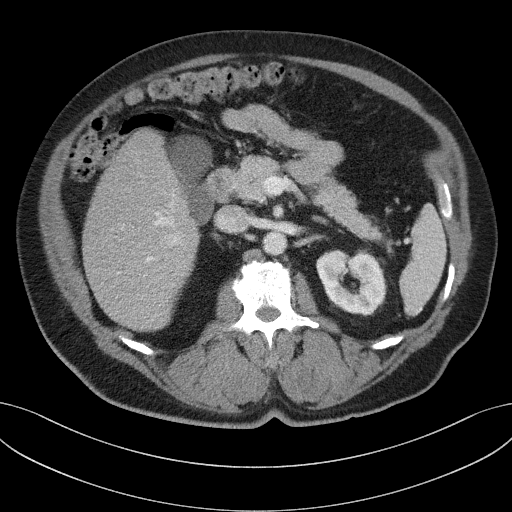
[im 65/82  soft-tissue]
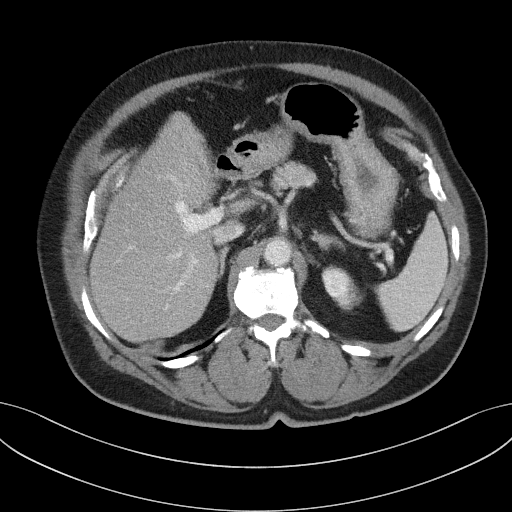
[im 71/82  soft-tissue]
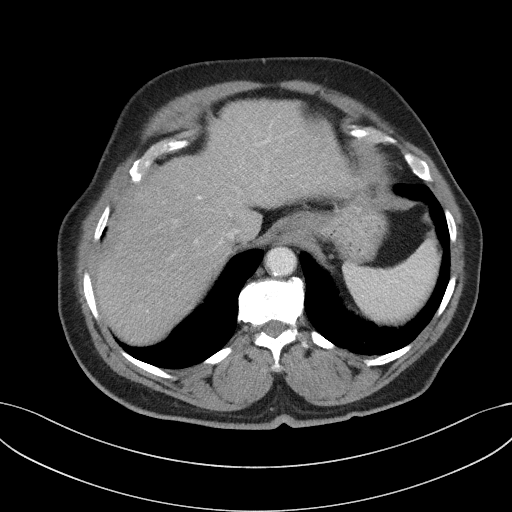
[im 76/82  soft-tissue]
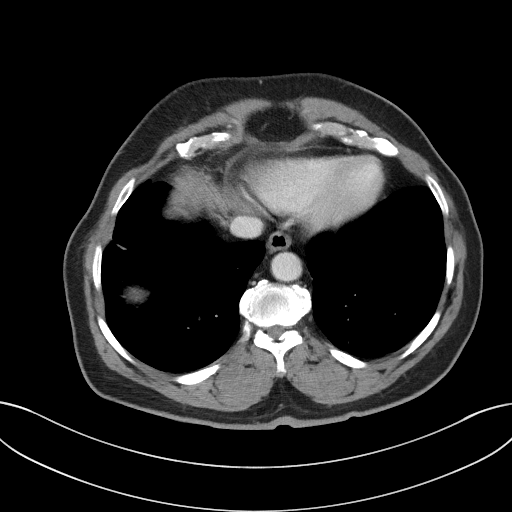

[Series 4: coronal st · coronal · 0.79mm/px · 3 of 105 slices shown]
[im 35/105  soft-tissue]
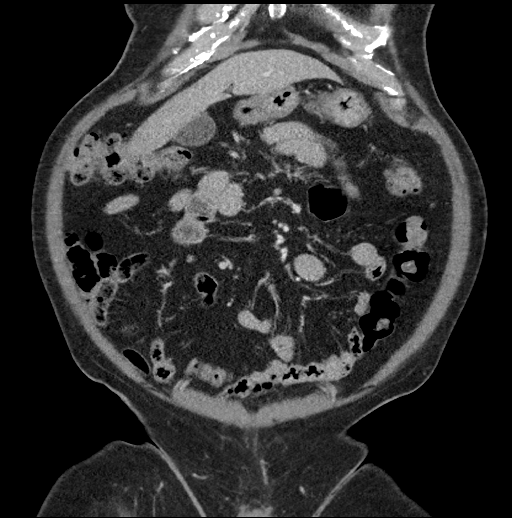
[im 47/105  soft-tissue]
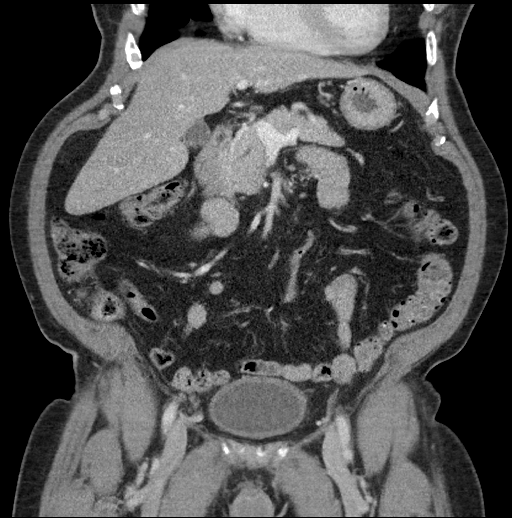
[im 58/105  soft-tissue]
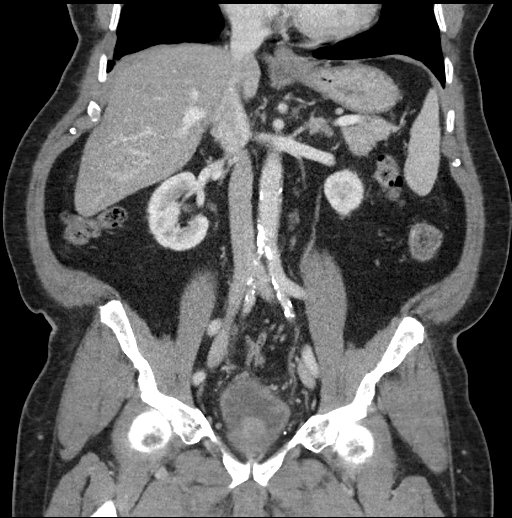

[17 of 46 positions shown; findings below may reference images not displayed]

FINDINGS: Lower chest: No significant pulmonary nodules or acute consolidative
airspace disease.

Hepatobiliary: Normal liver size. No liver mass. Normal gallbladder
with no radiopaque cholelithiasis. No biliary ductal dilatation.

Pancreas: Normal, with no mass or duct dilation.

Spleen: Normal size. No mass.

Adrenals/Urinary Tract: Normal adrenals. No hydronephrosis. No renal
masses. Chronic diffuse bladder wall thickening is not definitely
changed. No significant bladder distention.

Stomach/Bowel: Normal non-distended stomach. Normal caliber small
bowel with no small bowel wall thickening. Normal appendix. Normal
large bowel with no diverticulosis, large bowel wall thickening or
pericolonic fat stranding.

Vascular/Lymphatic: Atherosclerotic nonaneurysmal abdominal aorta.
Patent portal, splenic, hepatic and renal veins. No pathologically
enlarged lymph nodes in the abdomen or pelvis.

Reproductive: Marked prostatomegaly. Prostate with 6.0 cm,
previously 5.8 cm, minimally increased. Heterogeneous prostate
enhancement.

Other: No pneumoperitoneum, ascites or focal fluid collection.

Musculoskeletal: No discrete osseous lesions. Moderate thoracolumbar
spondylosis.
IMPRESSION: 1. No lymphadenopathy or other evidence of metastatic disease in the
abdomen or pelvis.
2. Chronic diffuse bladder wall thickening, probably due to chronic
bladder outlet obstruction by the markedly enlarged heterogeneous
prostate.
3. Aortic Atherosclerosis ([id number]).

## 2021-10-08 NOTE — Progress Notes (Signed)
..  Patient is receiving Assistance Medication - Supplied Externally. Medication: Eligard (leuprolide acetate) Manufacture: [employer] Approval Dates: Approved from 10/04/2021 until 10/12/2022. ID:   Reason: Self Pay First DOS: 11/01/2021.   Marland KitchenJuan Quam, CPhT IV Drug Replacement Specialist [place] Phone: [phone]

## 2021-10-15 ENCOUNTER — Other Ambulatory Visit: Payer: Self-pay | Admitting: Medical-Surgical

## 2021-10-25 ENCOUNTER — Ambulatory Visit: Payer: Self-pay | Admitting: Medical-Surgical

## 2021-11-01 ENCOUNTER — Inpatient Hospital Stay: Payer: Self-pay | Attending: Oncology

## 2021-11-01 ENCOUNTER — Inpatient Hospital Stay (HOSPITAL_BASED_OUTPATIENT_CLINIC_OR_DEPARTMENT_OTHER): Payer: Self-pay | Admitting: Oncology

## 2021-11-01 ENCOUNTER — Other Ambulatory Visit: Payer: Self-pay

## 2021-11-01 ENCOUNTER — Inpatient Hospital Stay: Payer: Self-pay

## 2021-11-01 VITALS — BP 181/79 | HR 103 | Temp 98.3°F | Resp 17 | Ht 68.0 in | Wt 200.1 lb

## 2021-11-01 VITALS — BP 162/78 | HR 100 | Temp 99.5°F | Resp 18

## 2021-11-01 DIAGNOSIS — C61 Malignant neoplasm of prostate: Secondary | ICD-10-CM | POA: Insufficient documentation

## 2021-11-01 DIAGNOSIS — Z7984 Long term (current) use of oral hypoglycemic drugs: Secondary | ICD-10-CM | POA: Insufficient documentation

## 2021-11-01 DIAGNOSIS — Z923 Personal history of irradiation: Secondary | ICD-10-CM | POA: Insufficient documentation

## 2021-11-01 DIAGNOSIS — Z791 Long term (current) use of non-steroidal anti-inflammatories (NSAID): Secondary | ICD-10-CM | POA: Insufficient documentation

## 2021-11-01 DIAGNOSIS — Z79899 Other long term (current) drug therapy: Secondary | ICD-10-CM | POA: Insufficient documentation

## 2021-11-01 LAB — CBC WITH DIFFERENTIAL (CANCER CENTER ONLY)
Abs Immature Granulocytes: 0.02 10*3/uL (ref 0.00–0.07)
Basophils Absolute: 0 10*3/uL (ref 0.0–0.1)
Basophils Relative: 0 %
Eosinophils Absolute: 0 10*3/uL (ref 0.0–0.5)
Eosinophils Relative: 0 %
HCT: 31.4 % — ABNORMAL LOW (ref 39.0–52.0)
Hemoglobin: 10.6 g/dL — ABNORMAL LOW (ref 13.0–17.0)
Immature Granulocytes: 1 %
Lymphocytes Relative: 30 %
Lymphs Abs: 1.2 10*3/uL (ref 0.7–4.0)
MCH: 29 pg (ref 26.0–34.0)
MCHC: 33.8 g/dL (ref 30.0–36.0)
MCV: 85.8 fL (ref 80.0–100.0)
Monocytes Absolute: 0.3 10*3/uL (ref 0.1–1.0)
Monocytes Relative: 8 %
Neutro Abs: 2.5 10*3/uL (ref 1.7–7.7)
Neutrophils Relative %: 61 %
Platelet Count: 139 10*3/uL — ABNORMAL LOW (ref 150–400)
RBC: 3.66 MIL/uL — ABNORMAL LOW (ref 4.22–5.81)
RDW: 13.4 % (ref 11.5–15.5)
WBC Count: 4.1 10*3/uL (ref 4.0–10.5)
nRBC: 0 % (ref 0.0–0.2)

## 2021-11-01 LAB — CMP (CANCER CENTER ONLY)
ALT: 26 U/L (ref 0–44)
AST: 23 U/L (ref 15–41)
Albumin: 4.5 g/dL (ref 3.5–5.0)
Alkaline Phosphatase: 51 U/L (ref 38–126)
Anion gap: 8 (ref 5–15)
BUN: 13 mg/dL (ref 8–23)
CO2: 28 mmol/L (ref 22–32)
Calcium: 10.8 mg/dL — ABNORMAL HIGH (ref 8.9–10.3)
Chloride: 104 mmol/L (ref 98–111)
Creatinine: 1.58 mg/dL — ABNORMAL HIGH (ref 0.61–1.24)
GFR, Estimated: 45 mL/min — ABNORMAL LOW (ref >60)
Glucose, Bld: 197 mg/dL — ABNORMAL HIGH (ref 70–99)
Potassium: 4 mmol/L (ref 3.5–5.1)
Sodium: 140 mmol/L (ref 135–145)
Total Bilirubin: 0.4 mg/dL (ref 0.3–1.2)
Total Protein: 7.9 g/dL (ref 6.5–8.1)

## 2021-11-01 MED ORDER — LEUPROLIDE ACETATE (6 MONTH) 45 MG ~~LOC~~ KIT
45.0000 mg | PACK | Freq: Once | SUBCUTANEOUS | Status: AC
  Administered 2021-11-01: 45 mg via SUBCUTANEOUS
  Filled 2021-11-01: qty 45

## 2021-11-01 NOTE — Progress Notes (Signed)
Hematology and Oncology Follow Up Visit  Caleb Robbins [id number] 1946/06/25 76 y.o. 11/01/2021 9:15 AM Caleb Fiscal, NP   Principle Diagnosis: 76 year old with prostate cancer diagnosed in 2019.  He was found to have a Gleason score of 8 and PSA of 122.   Prior Therapy: He received definitive therapy with radiation started in November 2021.  He received 45 Gray in 25 fractions to the prostate seminal vesicles and lymph nodes and boosted up to 75 Gray total.  Therapy concluded in January 2022.    He was also receiving androgen deprivation therapy every 6 months started in July 2021.   Current therapy: Androgen deprivation therapy alone to complete 2 years of therapy.  He will receive Eligard today.  Interim History: Mr.  Caleb Robbins returns today for a follow-up visit.  Since the last visit, he reports no major changes in his health.  He denies any fevers or chills or night sweats.  He has tolerated androgen deprivation therapy although has reported sexual dysfunction predominantly related to erectile dysfunction.  He denies any bone pain or pathological fractures.  He denies any weight loss or appetite changes.  His performance status quality of life remains unchanged.     Medications: I have reviewed the patient's current medications.  Current Outpatient Medications  Medication Sig Dispense Refill   amLODipine (NORVASC) 2.5 MG tablet Take 1 tablet (2.5 mg total) by mouth daily. 90 tablet 1   atorvastatin (LIPITOR) 20 MG tablet Take 1 tablet (20 mg total) by mouth daily. 90 tablet 3   glimepiride (AMARYL) 2 MG tablet Take 1 tablet (2 mg total) by mouth 2 (two) times daily. 180 tablet 1   ibuprofen (ADVIL) 800 MG tablet Take 800 mg by mouth every 8 (eight) hours as needed for headache or moderate pain.     losartan (COZAAR) 100 MG tablet Take 1 tablet (100 mg total) by mouth daily. 90 tablet 1   metFORMIN (GLUCOPHAGE) 1000 MG tablet Take 1 tablet by mouth twice daily 180 tablet 0    Multiple Vitamin (MULTIVITAMIN WITH MINERALS) TABS tablet Take 1 tablet by mouth daily.     phenazopyridine (PYRIDIUM) 95 MG tablet Take 2 tablets (190 mg total) by mouth 3 (three) times daily. 10 tablet 0   sildenafil (VIAGRA) 100 MG tablet Take 1 tablet (100 mg total) by mouth as needed. 10 tablet 1   tamsulosin (FLOMAX) 0.4 MG CAPS capsule Take 1 capsule (0.4 mg total) by mouth daily. 90 capsule 1   No current facility-administered medications for this visit.     Allergies: No Known Allergies    Physical Exam: Blood pressure (!) 181/79, pulse (!) 103, temperature 98.3 F (36.8 C), temperature source Temporal, resp. rate 17, height 5\' 8"  (1.727 m), weight 200 lb 1.6 oz (90.8 kg), SpO2 99 %.  ECOG: 1    General appearance: Comfortable appearing without any discomfort Head: Normocephalic without any trauma Oropharynx: Mucous membranes are moist and pink without any thrush or ulcers. Eyes: Pupils are equal and round reactive to light. Lymph nodes: No cervical, supraclavicular, inguinal or axillary lymphadenopathy.   Heart:regular rate and rhythm.  S1 and S2 without leg edema. Lung: Clear without any rhonchi or wheezes.  No dullness to percussion. Abdomin: Soft, nontender, nondistended with good bowel sounds.  No hepatosplenomegaly. Musculoskeletal: No joint deformity or effusion.  Full range of motion noted. Neurological: No deficits noted on motor, sensory and deep tendon reflex exam. Skin: No petechial rash or dryness.  Appeared moist.  Lab Results: Lab Results  Component Value Date   WBC 4.6 07/26/2021   HGB 10.6 (L) 07/26/2021   HCT 33.7 (L) 07/26/2021   MCV 90.3 07/26/2021   PLT 172 07/26/2021     Chemistry      Component Value Date/Time   NA 141 07/26/2021 0000   K 4.7 07/26/2021 0000   CL 102 07/26/2021 0000   CO2 29 07/26/2021 0000   BUN 14 07/26/2021 0000   CREATININE 1.38 (H) 07/26/2021 0000      Component Value Date/Time   CALCIUM 10.9 (H)  07/26/2021 0000   AST 18 07/26/2021 0000   ALT 18 07/26/2021 0000   BILITOT 0.3 07/26/2021 0000         Impression and Plan:   76 year old man with:  1.  Prostate cancer presented with a Gleason score of 8 and PSA 122 diagnosed in 2019.  He received definitive therapy with radiation and androgen deprivation.   The role for any therapy escalation were discussed at this time.  And I recommended deferring this option unless he has relapsed disease in the future.  We will update his PSA today and based on these findings we will determine whether any additional therapy is needed.  If his PSA continues to be very low then I would recommend continued active surveillance.   2.  Androgen deprivation therapy: He is to continue this treatment for a total of 2 years and suspended after that.  He will receive Eligard today and that will conclude androgen deprivation therapy for the time being.  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      3.  Follow-up: In 6 months for repeat follow-up.   30 minutes were dedicated to this encounter.  Time was spent on reviewing laboratory data, disease status update and outlining future plan of care discussion.   Zola Button, MD 1/20/20239:15 AM

## 2021-11-02 LAB — PROSTATE-SPECIFIC AG, SERUM (LABCORP): Prostate Specific Ag, Serum: 0.5 ng/mL (ref 0.0–4.0)

## 2021-11-13 ENCOUNTER — Telehealth: Payer: Self-pay | Admitting: General Practice

## 2021-11-13 NOTE — Telephone Encounter (Signed)
Transition Care Management Follow-up Telephone Call Date of discharge and from where: 11/11/21 from [place] How have you been since you were released from the hospital? He is doing fine. He has a dental infection. Any questions or concerns? No  Items Reviewed: Did the pt receive and understand the discharge instructions provided? Yes  Medications obtained and verified? Yes  Other? No  Any new allergies since your discharge? No  Dietary orders reviewed? Yes Do you have support at home? Yes   Home Care and Equipment/Supplies: Were home health services ordered? no  Functional Questionnaire: (I = Independent and D = Dependent) ADLs: I  Bathing/Dressing- I  Meal Prep- I  Eating- I  Maintaining continence- I  Transferring/Ambulation- I  Managing Meds- I  Follow up appointments reviewed:  PCP Hospital f/u appt confirmed? Yes  Scheduled to see Samuel Bouche, NP on 11/14/21 @ 1030. [facility] f/u appt confirmed? Yes  going to see the dentist today. Are transportation arrangements needed? No  If their condition worsens, is the pt aware to call PCP or go to the Emergency Dept.? Yes Was the patient provided with contact information for the PCP's office or ED? Yes Was to pt encouraged to call back with questions or concerns? Yes

## 2021-11-14 ENCOUNTER — Ambulatory Visit (INDEPENDENT_AMBULATORY_CARE_PROVIDER_SITE_OTHER): Payer: Self-pay | Admitting: Medical-Surgical

## 2021-11-14 ENCOUNTER — Other Ambulatory Visit: Payer: Self-pay

## 2021-11-14 ENCOUNTER — Encounter: Payer: Self-pay | Admitting: Medical-Surgical

## 2021-11-14 VITALS — BP 153/69 | HR 85 | Resp 20 | Ht 68.0 in | Wt 196.0 lb

## 2021-11-14 DIAGNOSIS — N289 Disorder of kidney and ureter, unspecified: Secondary | ICD-10-CM

## 2021-11-14 DIAGNOSIS — Z09 Encounter for follow-up examination after completed treatment for conditions other than malignant neoplasm: Secondary | ICD-10-CM

## 2021-11-14 DIAGNOSIS — I10 Essential (primary) hypertension: Secondary | ICD-10-CM

## 2021-11-14 DIAGNOSIS — E1165 Type 2 diabetes mellitus with hyperglycemia: Secondary | ICD-10-CM

## 2021-11-14 LAB — BASIC METABOLIC PANEL WITH GFR
BUN/Creatinine Ratio: 9 (calc) (ref 6–22)
BUN: 12 mg/dL (ref 7–25)
CO2: 28 mmol/L (ref 20–32)
Calcium: 10.7 mg/dL — ABNORMAL HIGH (ref 8.6–10.3)
Chloride: 104 mmol/L (ref 98–110)
Creat: 1.31 mg/dL — ABNORMAL HIGH (ref 0.70–1.28)
Glucose, Bld: 163 mg/dL — ABNORMAL HIGH (ref 65–99)
Potassium: 4.5 mmol/L (ref 3.5–5.3)
Sodium: 141 mmol/L (ref 135–146)
eGFR: 57 mL/min/{1.73_m2} — ABNORMAL LOW (ref >=60)

## 2021-11-14 MED ORDER — AMLODIPINE BESYLATE 5 MG PO TABS: 5.0000 mg | ORAL_TABLET | Freq: Every day | ORAL | 1 refills | Status: DC

## 2021-11-14 NOTE — Progress Notes (Signed)
°  HPI with pertinent ROS:   CC: Hospital discharge follow-up  HPI: Pleasant 76 year old male presenting today for hospital follow-up.  He was seen in the emergency room for a dental abscess on 11/11/2021.  He was noted to have an elevation in blood pressure as well as reduced kidney function.  They instructed him to hold his metformin for at least 48 hours after the CT scan and to follow-up with community service [place] for his dental care. He was prescribed 7 days of Augmentin which he has been taking as prescribed.  Notes his symptoms are greatly improved.  He has already been able to follow-up with the [facility] for dental care.  He has his notebook today with blood pressure readings at home.  Most of them are 130/80 or higher with some in the 150s/90s.  He has been taking losartan and amlodipine as prescribed, tolerating well without side effects.  Was instructed to hold his metformin which he has done and plans to restart today.  He was given the contrast with his CT scan in the hospital.  Is due for recheck of renal function.  I reviewed the past medical history, family history, social history, surgical history, and allergies today and no changes were needed.  Please see the problem list section below in epic for further details.   Physical exam:   General: Well Developed, well nourished, and in no acute distress.  Neuro: Alert and oriented x3.  HEENT: Normocephalic, atraumatic.  Skin: Warm and dry. Cardiac: Regular rate and rhythm, no murmurs rubs or gallops, no lower extremity edema.  Respiratory: Clear to auscultation bilaterally. Not using accessory muscles, speaking in full sentences.  Impression and Recommendations:    1. Hospital discharge follow-up Reviewed hospitalization records available in care everywhere/epic.  2. Essential hypertension Blood pressure remains high today.  Reviewed his home records and note that blood pressure readings are on average  greater than 130/80.  Continue losartan 100 mg daily.  Increase amlodipine to 5 mg daily.  New prescription sent to pharmacy.  3. Type 2 diabetes mellitus with hyperglycemia, without long-term current use of insulin (HCC) Rechecking renal function.  Plan to restart metformin today as prescribed.   Return in about 2 weeks (around 11/28/2021) for nurse visit for BP check. ___________________________________________ Clearnce Sorrel, DNP, APRN, FNP-BC Primary Care and [place]

## 2021-11-15 ENCOUNTER — Other Ambulatory Visit: Payer: Self-pay

## 2021-11-15 MED ORDER — LOSARTAN POTASSIUM 100 MG PO TABS: 100.0000 mg | ORAL_TABLET | Freq: Every day | ORAL | 1 refills | Status: DC

## 2021-11-28 ENCOUNTER — Other Ambulatory Visit: Payer: Self-pay

## 2021-11-28 ENCOUNTER — Ambulatory Visit (INDEPENDENT_AMBULATORY_CARE_PROVIDER_SITE_OTHER): Payer: Self-pay | Admitting: Medical-Surgical

## 2021-11-28 VITALS — BP 168/64 | HR 89

## 2021-11-28 DIAGNOSIS — I10 Essential (primary) hypertension: Secondary | ICD-10-CM

## 2021-11-28 NOTE — Progress Notes (Signed)
Agree with documentation as above.  ? ?___________________________________________ ?Nami Strawder L. Chika Cichowski, DNP, APRN, FNP-BC ?Primary Care and Sports Medicine ?[facility] ? ?

## 2021-11-28 NOTE — Progress Notes (Signed)
Patient comes in today for blood pressure check.   Caleb Robbins is taking Amlodipine 5 mg daily for blood pressure control. Caleb Robbins denies any missed doses, side effects, chest pain, palpitations, dizziness, or shortness of breath. He does endorse daily left sided headaches since his last visit.   He confused the directions on his medication. Patient was advised at visit on 11/14/2021 to STOP Amlodipine 2.5 mg and START Amlodipine 5 mg daily along with Losartan 100 mg he was already taking. Patient thought he needed to stop Losartan and has not taken it since 11/14/2021.   Caleb Robbins has been checking blood pressure readings at home since last visit and the readings are as follows:  152/76  155/82 149/71  155/87 138/77  168/84  169/84  164/81 157/77    His first blood pressure reading today is: 168/64. A second reading was not taken.   Since patient has been off blood pressure medication he was instructed to restart Losartan 100 mg along with Amlodipine 5 mg daily and return in 2 weeks for another blood pressure check. Written instructions were given and I also wrote on his pill bottles to help with the language and communication barrier. Patient was instructed to try ibuprofen or tylenol for headaches (he has not been treating them with anything) and these would hopefully resolve when blood pressure was under better control.

## 2021-11-28 NOTE — Patient Instructions (Signed)
Take Losartan 100 mg one tablet daily  Take Amlodipine 5 mg one tablet daily  Can take Tylenol or Ibuprofen for head pain, drink water, work on blood pressure control.   Follow up in 2 weeks for a nurse visit to recheck blood pressure.

## 2021-12-12 ENCOUNTER — Ambulatory Visit: Payer: Self-pay

## 2022-01-02 ENCOUNTER — Other Ambulatory Visit: Payer: Self-pay | Admitting: Medical-Surgical

## 2022-01-23 ENCOUNTER — Other Ambulatory Visit: Payer: Self-pay | Admitting: Medical-Surgical

## 2022-02-14 ENCOUNTER — Telehealth: Payer: Self-pay | Admitting: General Practice

## 2022-02-14 NOTE — Telephone Encounter (Signed)
Transition Care Management Follow-up Telephone Call ?Date of discharge and from where: 02/13/22 from [facility] ?How have you been since you were released from the hospital? Per his daughter, patient is doing well. They will call back to make an appointment as needed. ?Any questions or concerns? No ? ?Items Reviewed: ?Did the pt receive and understand the discharge instructions provided? Yes  ?Medications obtained and verified? No  ?Other? No  ?Any new allergies since your discharge? No  ?Dietary orders reviewed? No ?Do you have support at home? Yes  ? ?Home Care and Equipment/Supplies: ?Were home health services ordered? no ? ?Functional Questionnaire: (I = Independent and D = Dependent) ?ADLs: I ? ?Bathing/Dressing- I ? ?Meal Prep- I ? ?Eating- I ? ?Maintaining continence- I ? ?Transferring/Ambulation- I ? ?Managing Meds- I ? ?Follow up appointments reviewed: ? ?PCP Hospital f/u appt confirmed? No   ?[facility] f/u appt confirmed? No   ?Are transportation arrangements needed? No  ?If their condition worsens, is the pt aware to call PCP or go to the Emergency Dept.? Yes ?Was the patient provided with contact information for the PCP's office or ED? Yes ?Was to pt encouraged to call back with questions or concerns? Yes  ?

## 2022-03-01 ENCOUNTER — Other Ambulatory Visit: Payer: Self-pay | Admitting: Medical-Surgical

## 2022-03-04 ENCOUNTER — Telehealth: Payer: Self-pay | Admitting: Medical-Surgical

## 2022-03-04 NOTE — Telephone Encounter (Signed)
Patient called to schedule an appt to get refills for 3 months. He will be leaving to go out of town on Tuesday and there are not available appts outside of same day or virtual visits.

## 2022-03-04 NOTE — Telephone Encounter (Signed)
Patient and his wife are both scheduled on 05/25 at 10:50 and 11:10

## 2022-03-04 NOTE — Telephone Encounter (Signed)
Patient needs to be seen since his blood pressure has not been controlled and he did not follow up as instructed. Cannot safely prescribe medications if we cannot monitor him and his response. Please schedule wherever possible for follow up.   ___________________________________________ Clearnce Sorrel, DNP, APRN, FNP-BC Primary Care and [place]

## 2022-03-05 DIAGNOSIS — E1165 Type 2 diabetes mellitus with hyperglycemia: Secondary | ICD-10-CM | POA: Insufficient documentation

## 2022-03-05 NOTE — Progress Notes (Unsigned)
   Established Patient Office Visit  Subjective   Patient ID: Caleb Robbins, male    DOB: 1945/11/28  Age: 76 y.o. MRN: [id number]  No chief complaint on file.   HPI Pleasant 76 year old male presenting today for the following:  HTN:  DM:  ROS    Objective:     There were no vitals taken for this visit. {Vitals History (Optional):23777}  Physical Exam   No results found for any visits on 03/06/22.  {Labs (Optional):23779}  The 10-year ASCVD risk score (Arnett DK, et al., 2019) is: 74.4%    Assessment & Plan:   Problem List Items Addressed This Visit       Cardiovascular and Mediastinum   Essential hypertension - Primary     Endocrine   Hyperglycemia due to type 2 diabetes mellitus ([place])    No follow-ups on file.    Samuel Bouche, NP

## 2022-03-06 ENCOUNTER — Encounter: Payer: Self-pay | Admitting: Medical-Surgical

## 2022-03-06 ENCOUNTER — Ambulatory Visit (INDEPENDENT_AMBULATORY_CARE_PROVIDER_SITE_OTHER): Payer: Self-pay | Admitting: Medical-Surgical

## 2022-03-06 VITALS — BP 153/77 | HR 74 | Resp 20 | Ht 68.0 in | Wt 188.6 lb

## 2022-03-06 DIAGNOSIS — M109 Gout, unspecified: Secondary | ICD-10-CM | POA: Insufficient documentation

## 2022-03-06 DIAGNOSIS — I10 Essential (primary) hypertension: Secondary | ICD-10-CM

## 2022-03-06 DIAGNOSIS — E1165 Type 2 diabetes mellitus with hyperglycemia: Secondary | ICD-10-CM

## 2022-03-06 LAB — POCT GLYCOSYLATED HEMOGLOBIN (HGB A1C): HbA1c, POC (controlled diabetic range): 6.6 % (ref 0.0–7.0)

## 2022-03-06 LAB — POCT UA - MICROALBUMIN
Creatinine, POC: 50 mg/dL
Microalbumin Ur, POC: 10 mg/L

## 2022-03-06 MED ORDER — METFORMIN HCL 1000 MG PO TABS: ORAL_TABLET | ORAL | 1 refills | Status: DC

## 2022-03-06 MED ORDER — AMLODIPINE BESYLATE 10 MG PO TABS: 10.0000 mg | ORAL_TABLET | Freq: Every day | ORAL | 1 refills | Status: DC

## 2022-03-06 MED ORDER — LOSARTAN POTASSIUM 100 MG PO TABS: 100.0000 mg | ORAL_TABLET | Freq: Every day | ORAL | 1 refills | Status: DC

## 2022-03-06 MED ORDER — GLIMEPIRIDE 2 MG PO TABS: ORAL_TABLET | ORAL | 1 refills | Status: DC

## 2022-03-06 MED ORDER — ALLOPURINOL 100 MG PO TABS: 100.0000 mg | ORAL_TABLET | Freq: Every day | ORAL | 1 refills | Status: DC

## 2022-03-06 NOTE — Assessment & Plan Note (Signed)
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  Checking uric acid today.  Starting allopurinol 100 mg daily.

## 2022-03-06 NOTE — Assessment & Plan Note (Addendum)
Checking labs.  Blood pressure elevated on arrival but slightly better on recheck.  Still not quite at goal.  Increase amlodipine to 10 mg daily.  Continue losartan 100 mg daily.  Discussed the risk of lower extremity swelling and he will watch out for that and let me know if this occurs.  Continue checking blood pressure at home with goal of 130/80 or less.

## 2022-03-06 NOTE — Assessment & Plan Note (Signed)
POCT hemoglobin A1c 6.6%.  Urine microalbumin abnormal.  Continue metformin and diabetic diet modifications.  Continue checking sugars at home.

## 2022-03-07 ENCOUNTER — Encounter: Payer: Self-pay | Admitting: Neurology

## 2022-03-07 LAB — LIPID PANEL
Cholesterol: 119 mg/dL (ref <200)
HDL: 56 mg/dL (ref >=40)
LDL Cholesterol (Calc): 49 mg/dL (calc)
Non-HDL Cholesterol (Calc): 63 mg/dL (calc) (ref <130)
Total CHOL/HDL Ratio: 2.1 (calc) (ref <5.0)
Triglycerides: 61 mg/dL (ref <150)

## 2022-03-07 LAB — CBC WITH DIFFERENTIAL/PLATELET
Absolute Monocytes: 226 cells/uL (ref 200–950)
Basophils Absolute: 12 cells/uL (ref 0–200)
Basophils Relative: 0.3 %
Eosinophils Absolute: 12 cells/uL — ABNORMAL LOW (ref 15–500)
Eosinophils Relative: 0.3 %
HCT: 33 % — ABNORMAL LOW (ref 38.5–50.0)
Hemoglobin: 10.7 g/dL — ABNORMAL LOW (ref 13.2–17.1)
Lymphs Abs: 811 cells/uL — ABNORMAL LOW (ref 850–3900)
MCH: 28.8 pg (ref 27.0–33.0)
MCHC: 32.4 g/dL (ref 32.0–36.0)
MCV: 88.9 fL (ref 80.0–100.0)
MPV: 10.8 fL (ref 7.5–12.5)
Monocytes Relative: 5.8 %
Neutro Abs: 2839 cells/uL (ref 1500–7800)
Neutrophils Relative %: 72.8 %
Platelets: 200 10*3/uL (ref 140–400)
RBC: 3.71 10*6/uL — ABNORMAL LOW (ref 4.20–5.80)
RDW: 14 % (ref 11.0–15.0)
Total Lymphocyte: 20.8 %
WBC: 3.9 10*3/uL (ref 3.8–10.8)

## 2022-03-07 LAB — COMPLETE METABOLIC PANEL WITH GFR
AG Ratio: 1.3 (calc) (ref 1.0–2.5)
ALT: 13 U/L (ref 9–46)
AST: 16 U/L (ref 10–35)
Albumin: 4.5 g/dL (ref 3.6–5.1)
Alkaline phosphatase (APISO): 61 U/L (ref 35–144)
BUN/Creatinine Ratio: 10 (calc) (ref 6–22)
BUN: 15 mg/dL (ref 7–25)
CO2: 23 mmol/L (ref 20–32)
Calcium: 10.7 mg/dL — ABNORMAL HIGH (ref 8.6–10.3)
Chloride: 104 mmol/L (ref 98–110)
Creat: 1.48 mg/dL — ABNORMAL HIGH (ref 0.70–1.28)
Globulin: 3.4 g/dL (calc) (ref 1.9–3.7)
Glucose, Bld: 119 mg/dL — ABNORMAL HIGH (ref 65–99)
Potassium: 4.8 mmol/L (ref 3.5–5.3)
Sodium: 142 mmol/L (ref 135–146)
Total Bilirubin: 0.4 mg/dL (ref 0.2–1.2)
Total Protein: 7.9 g/dL (ref 6.1–8.1)
eGFR: 49 mL/min/{1.73_m2} — ABNORMAL LOW (ref >=60)

## 2022-03-07 LAB — URIC ACID: Uric Acid, Serum: 6.2 mg/dL (ref 4.0–8.0)

## 2022-03-18 ENCOUNTER — Telehealth: Payer: Self-pay | Admitting: Oncology

## 2022-03-18 NOTE — Telephone Encounter (Signed)
Called patient regarding upcoming rescheduled appointment due to provider pal, patient has been called and notified.

## 2022-04-14 ENCOUNTER — Telehealth: Payer: Self-pay | Admitting: General Practice

## 2022-04-14 NOTE — Telephone Encounter (Signed)
Transition Care Management Follow-up Telephone Call Date of discharge and from where: 04/12/22 from [facility] How have you been since you were released from the hospital? Patients daughter states that he is doing better. Any questions or concerns? No  Items Reviewed: Did the pt receive and understand the discharge instructions provided? Yes  Medications obtained and verified? No  Other? No  Any new allergies since your discharge? No  Dietary orders reviewed? Yes Do you have support at home? Yes   Home Care and Equipment/Supplies: Were home health services ordered? no  Functional Questionnaire: (I = Independent and D = Dependent) ADLs: I  Bathing/Dressing- I  Meal Prep- I  Eating- I  Maintaining continence- I  Transferring/Ambulation- I  Managing Meds- I  Follow up appointments reviewed:  PCP Hospital f/u appt confirmed? Yes  Scheduled to see Samuel Bouche, NP on 04/24/22 @ 1600. [facility] f/u appt confirmed? No  Are transportation arrangements needed? No  If their condition worsens, is the pt aware to call PCP or go to the Emergency Dept.? Yes Was the patient provided with contact information for the PCP's office or ED? Yes Was to pt encouraged to call back with questions or concerns? Yes

## 2022-04-24 ENCOUNTER — Encounter: Payer: Self-pay | Admitting: Medical-Surgical

## 2022-04-24 ENCOUNTER — Ambulatory Visit (INDEPENDENT_AMBULATORY_CARE_PROVIDER_SITE_OTHER): Payer: Self-pay | Admitting: Medical-Surgical

## 2022-04-24 VITALS — BP 180/75 | HR 87 | Ht 72.0 in | Wt 189.0 lb

## 2022-04-24 DIAGNOSIS — Z09 Encounter for follow-up examination after completed treatment for conditions other than malignant neoplasm: Secondary | ICD-10-CM

## 2022-04-24 NOTE — Progress Notes (Signed)
Established Patient Office Visit  Subjective   Patient ID: Caleb Robbins, male   DOB: [birth year] Age: 76 y.o. MRN: [id number]   Chief Complaint  Patient presents with   Hospitalization Follow-up    HPI Pleasant 76 year old male accompanied by his wife presenting today for a hospital discharge follow up. Professional interpreter, Ebony Cargo, utilized during the appointment. Recently travelled to [place] for 1 month. On his return, noted that he had bilateral lower extremity edema affecting the feet, ankles, and calves. Was seen in the ED on 04/12/22 and his workup was very reassuring. He was discharged with instructions to follow up with his PCP. Today, he notes that the swelling has improved although it is still mildly present on the right foot. No pain to his legs but feels like he is walking on foam at times. No shortness of breath, cough, chest pain, fever, or chills.    Objective:    Vitals:   04/24/22 1659  BP: (!) 180/75  Pulse: 87  Height: 6' (1.829 m)  Weight: 189 lb 0.6 oz (85.7 kg)  SpO2: 99%  BMI (Calculated): 25.63   Physical Exam Vitals reviewed.  Constitutional:      General: He is not in acute distress.    Appearance: Normal appearance. He is obese. He is not ill-appearing.  HENT:     Head: Normocephalic.  Cardiovascular:     Rate and Rhythm: Normal rate.     Pulses: Normal pulses.     Heart sounds: Normal heart sounds. No murmur heard.    No friction rub. No gallop.  Pulmonary:     Effort: Pulmonary effort is normal. No respiratory distress.     Breath sounds: Normal breath sounds.  Musculoskeletal:     Right lower leg: Edema present.     Left lower leg: No edema.  Skin:    General: Skin is warm and dry.  Neurological:     Mental Status: He is alert and oriented to person, place, and time.  Psychiatric:        Mood and Affect: Mood normal.        Behavior: Behavior normal.        Thought Content: Thought content normal.        Judgment: Judgment normal.    No results found for this or any previous visit (from the past 24 hour(s)).     The ASCVD Risk score (Arnett DK, et al., 2019) failed to calculate for the following reasons:   The valid total cholesterol range is 130 to 320 mg/dL   Assessment & Plan:   1. Hospital discharge follow-up Reviewed records in [place]. Edema does appear to be improving. Recommend compression socks and sodium restriction. Of note, he does have a poor understanding of his prescribed medications and their indications. After an in depth medication review, he notes he has been taking both '5mg'$  and '10mg'$  of Amlodipine daily. Feel that the swelling he experienced is likely related to overdose of CCB, venous insufficiency, uncontrolled blood pressure, and travel. Clarified Amlodipine dose to be a max of '10mg'$  per day. Home BP monitoring shows BP in the 831-517 range systolically so no further changes for now. Limit dietary sodium. Elevate feet when sitting. Monitor for additional s/s or worsening of the edema.   Return for follow up as scheduled in August.  Greater than 45 minutes was spent in face to face counseling and care of the patient using interpreter services.  ___________________________________________ Clearnce Sorrel, DNP, APRN, FNP-BC Primary  Care and [place]

## 2022-04-25 ENCOUNTER — Encounter: Payer: Self-pay | Admitting: Medical-Surgical

## 2022-05-02 ENCOUNTER — Ambulatory Visit: Payer: Self-pay | Admitting: Oncology

## 2022-05-02 ENCOUNTER — Other Ambulatory Visit: Payer: Self-pay

## 2022-06-04 NOTE — Progress Notes (Signed)
Erroneous encounter. Please disregard.

## 2022-06-05 ENCOUNTER — Encounter: Payer: Self-pay | Admitting: Medical-Surgical

## 2022-06-05 DIAGNOSIS — I10 Essential (primary) hypertension: Secondary | ICD-10-CM

## 2022-06-06 ENCOUNTER — Telehealth: Payer: Self-pay | Admitting: General Practice

## 2022-06-06 NOTE — Telephone Encounter (Signed)
Transition Care Management Follow-up Telephone Call Date of discharge and from where: 06/04/22 from [facility] How have you been since you were released from the hospital? Patient is doing a little better. Has a hospital follow up scheduled. Any questions or concerns? No  Items Reviewed: Did the pt receive and understand the discharge instructions provided? Yes  Medications obtained and verified? No  Other? No  Any new allergies since your discharge? No  Dietary orders reviewed? Yes Do you have support at home? Yes   Home Care and Equipment/Supplies: Were home health services ordered? no  Functional Questionnaire: (I = Independent and D = Dependent) ADLs: I  Bathing/Dressing- I  Meal Prep- II  Eating- I  Maintaining continence- I  Transferring/Ambulation- I  Managing Meds- I  Follow up appointments reviewed:  PCP Hospital f/u appt confirmed? Yes  Scheduled to see Caleb Bouche, Np on 07/10/22. [facility] f/u appt confirmed? No   Are transportation arrangements needed? No  If their condition worsens, is the pt aware to call PCP or go to the Emergency Dept.? Yes Was the patient provided with contact information for the PCP's office or ED? Yes Was to pt encouraged to call back with questions or concerns? Yes

## 2022-06-12 ENCOUNTER — Inpatient Hospital Stay: Payer: Self-pay | Attending: Medical-Surgical

## 2022-06-12 ENCOUNTER — Inpatient Hospital Stay: Payer: Self-pay | Admitting: Oncology

## 2022-06-13 ENCOUNTER — Telehealth: Payer: Self-pay | Admitting: Oncology

## 2022-06-13 NOTE — Telephone Encounter (Signed)
Rescheduled per 08/31 scheduled message, called and spoke with patient's daughter. Patient will be notified.

## 2022-06-18 ENCOUNTER — Inpatient Hospital Stay: Payer: Self-pay | Admitting: Oncology

## 2022-06-18 ENCOUNTER — Telehealth: Payer: Self-pay | Admitting: *Deleted

## 2022-06-18 ENCOUNTER — Inpatient Hospital Stay: Payer: Self-pay | Attending: Medical-Surgical

## 2022-06-18 NOTE — Telephone Encounter (Signed)
PC to patient's number, spoke with his daughter Alma Friendly about his missed appointments this a.m.  She states he came to [facility], had labs drawn & was told he did not have a MD appointment.  Informed Alma Friendly the patient has not checked in here, [facility] address verified, she is not sure what office he went to.  Per Dr Alen Blew, patient needs to reschedule.  Lydia informed scheduling will be contacting patient, she verbalizes understanding.  Scheduling message sent.

## 2022-06-20 ENCOUNTER — Telehealth: Payer: Self-pay | Admitting: Oncology

## 2022-06-20 NOTE — Telephone Encounter (Signed)
Scheduled per 9/7 in basket, message has been left with pt daughter

## 2022-07-10 ENCOUNTER — Encounter: Payer: Self-pay | Admitting: Medical-Surgical

## 2022-07-10 ENCOUNTER — Ambulatory Visit (INDEPENDENT_AMBULATORY_CARE_PROVIDER_SITE_OTHER): Payer: Self-pay | Admitting: Medical-Surgical

## 2022-07-10 VITALS — BP 181/70 | HR 93 | Resp 20 | Ht 72.0 in | Wt 187.7 lb

## 2022-07-10 DIAGNOSIS — Z23 Encounter for immunization: Secondary | ICD-10-CM

## 2022-07-10 DIAGNOSIS — M109 Gout, unspecified: Secondary | ICD-10-CM

## 2022-07-10 DIAGNOSIS — E1169 Type 2 diabetes mellitus with other specified complication: Secondary | ICD-10-CM

## 2022-07-10 DIAGNOSIS — E669 Obesity, unspecified: Secondary | ICD-10-CM | POA: Insufficient documentation

## 2022-07-10 DIAGNOSIS — E1165 Type 2 diabetes mellitus with hyperglycemia: Secondary | ICD-10-CM

## 2022-07-10 DIAGNOSIS — R399 Unspecified symptoms and signs involving the genitourinary system: Secondary | ICD-10-CM

## 2022-07-10 DIAGNOSIS — E785 Hyperlipidemia, unspecified: Secondary | ICD-10-CM

## 2022-07-10 DIAGNOSIS — I10 Essential (primary) hypertension: Secondary | ICD-10-CM

## 2022-07-10 MED ORDER — LOSARTAN POTASSIUM 100 MG PO TABS: 100.0000 mg | ORAL_TABLET | Freq: Every day | ORAL | 1 refills | Status: DC

## 2022-07-10 MED ORDER — METFORMIN HCL 1000 MG PO TABS: ORAL_TABLET | ORAL | 1 refills | Status: DC

## 2022-07-10 MED ORDER — ATORVASTATIN CALCIUM 20 MG PO TABS: 20.0000 mg | ORAL_TABLET | Freq: Every day | ORAL | 3 refills | Status: DC

## 2022-07-10 MED ORDER — HYDROCHLOROTHIAZIDE 25 MG PO TABS: 12.5000 mg | ORAL_TABLET | Freq: Every day | ORAL | 1 refills | Status: DC

## 2022-07-10 MED ORDER — TAMSULOSIN HCL 0.4 MG PO CAPS: 0.4000 mg | ORAL_CAPSULE | Freq: Every day | ORAL | 1 refills | Status: DC

## 2022-07-10 MED ORDER — ALLOPURINOL 100 MG PO TABS: 100.0000 mg | ORAL_TABLET | Freq: Every day | ORAL | 1 refills | Status: DC

## 2022-07-10 MED ORDER — GABAPENTIN 300 MG PO CAPS: 300.0000 mg | ORAL_CAPSULE | Freq: Two times a day (BID) | ORAL | 3 refills | Status: DC

## 2022-07-10 MED ORDER — GLIMEPIRIDE 2 MG PO TABS: ORAL_TABLET | ORAL | 1 refills | Status: DC

## 2022-07-10 MED ORDER — AMLODIPINE BESYLATE 10 MG PO TABS: 10.0000 mg | ORAL_TABLET | Freq: Every day | ORAL | 1 refills | Status: DC

## 2022-07-10 NOTE — Progress Notes (Signed)
Established Patient Office Visit  Subjective   Patient ID: Caleb Robbins, male   DOB: 07-22-1946 Age: 76 y.o. MRN: [id number]   Chief Complaint  Patient presents with   Hypertension    HPI Pleasant 76 year old male accompanied by his wife presenting for chronic disease follow up. Twi interpreter Kwaku # [phone] provided translation services for the appointment.   HTN: taking Amlodipine '10mg'$  and losartan '100mg'$  daily, tolerating well without side effects. Checking BP at home and readings have been somewhat labile with most above goal.  Following an ethnic diet.  Staying active however no intentional exercise. Denies CP, SOB, palpitations, lower extremity edema, dizziness, headaches, or vision changes.  Diabetes: Taking metformin 1000 mg twice daily and glimepiride 2 mg twice daily as prescribed, tolerating well without side effects.  Checking blood sugars at home with fasting readings below 120.  Has recently noticed that his right foot has some change in sensation and it often feels like he is stepping on a cushion.  At times this is uncomfortable but otherwise not painful.  HLD: Taking atorvastatin 20 mg daily, tolerating well without side effects.  LUTS: Taking Flomax 0.4 mg daily, tolerating well well without side effects.  This keeps his symptoms well managed.  Gouty arthritis: Taking allopurinol 100 mg daily.  No recent gout flares.   Objective:    Vitals:   07/10/22 1524  BP: (!) 181/70  Pulse: 93  Resp: 20  Height: 6' (1.829 m)  Weight: 187 lb 11.2 oz (85.1 kg)  SpO2: 99%  BMI (Calculated): 25.45    Physical Exam Vitals reviewed.  Constitutional:      General: He is not in acute distress.    Appearance: Normal appearance. He is obese. He is not ill-appearing.  HENT:     Head: Normocephalic.  Cardiovascular:     Rate and Rhythm: Normal rate.     Pulses: Normal pulses.     Heart sounds: Normal heart sounds. No murmur heard.    No friction rub. No gallop.   Pulmonary:     Effort: Pulmonary effort is normal. No respiratory distress.     Breath sounds: Normal breath sounds.  Skin:    General: Skin is warm and dry.  Neurological:     Mental Status: He is alert and oriented to person, place, and time.  Psychiatric:        Mood and Affect: Mood normal.        Behavior: Behavior normal.        Thought Content: Thought content normal.        Judgment: Judgment normal.   No results found for this or any previous visit (from the past 24 hour(s)).     The ASCVD Risk score (Arnett DK, et al., 2019) failed to calculate for the following reasons:   The valid total cholesterol range is 130 to 320 mg/dL   Assessment & Plan:   1. Essential hypertension Blood pressure not well controlled today and most home readings are outside of goal.  Continue losartan and amlodipine as prescribed.  Adding hydrochlorothiazide 12.5 mg daily.  Continue monitoring blood pressure with a goal of 130/80 or less.  Low-sodium diet and regular intentional exercise recommended with a goal for maintenance of a healthy weight.  2. Type 2 diabetes mellitus with hyperglycemia, without long-term current use of insulin ([place]) Not due for A1c check.  Continue monitoring blood sugars at home with a fasting goal of less than 130.  Continue metformin and glimepiride  as prescribed.  Concern for right foot sensation changes as possible neuropathy.  Since he is having some other body aches and pains, starting gabapentin 300 mg twice daily.  Advised that this medication may cause some sedation so he should try it when he has plenty of time to see how he responds to it.  3. Hyperlipidemia associated with type 2 diabetes mellitus (HCC) Continue atorvastatin 20 mg daily.  4. Need for influenza vaccination Flu vaccine given in office. - Flu Vaccine QUAD High Dose(Fluad)  5. Lower urinary tract symptoms (LUTS) Continue Flomax 0.4 mg daily.  6. Gouty arthritis Continue allopurinol 100 mg  daily.  Return in about 2 weeks (around 07/24/2022) for nurse visit for BP check.  ___________________________________________ Clearnce Sorrel, DNP, APRN, FNP-BC Primary Care and [place]

## 2022-07-25 ENCOUNTER — Ambulatory Visit: Payer: Self-pay

## 2022-07-29 ENCOUNTER — Ambulatory Visit (INDEPENDENT_AMBULATORY_CARE_PROVIDER_SITE_OTHER): Payer: Self-pay | Admitting: Medical-Surgical

## 2022-07-29 VITALS — BP 137/60 | HR 100

## 2022-07-29 DIAGNOSIS — I10 Essential (primary) hypertension: Secondary | ICD-10-CM

## 2022-07-29 NOTE — Progress Notes (Signed)
   Established Patient Office Visit  Subjective   Patient ID: Caleb Robbins, male    DOB: 1946-04-25  Age: 76 y.o. MRN: [id number]  Chief Complaint  Patient presents with   Hypertension    HPI  Caleb Robbins is here for blood pressure check. Denies chest pain, shortness of breath or dizziness.   ROS    Objective:     BP 137/60   Pulse 100   SpO2 100%    Physical Exam   No results found for any visits on 07/29/22.    The ASCVD Risk score (Arnett DK, et al., 2019) failed to calculate for the following reasons:   The valid total cholesterol range is 130 to 320 mg/dL    Assessment & Plan:  Hypertension - Patient advised to continue medications as directed. Follow up in 3 months with Joy.   Problem List Items Addressed This Visit       Unprioritized   Essential hypertension - Primary    Return in about 3 months (around 10/29/2022) for HTN/aht.    Lavell Luster, [place]

## 2022-07-29 NOTE — Progress Notes (Signed)
Agree with documentation as below.  ___________________________________________ Nickcole Bralley L. Nataleigh Griffin, DNP, APRN, FNP-BC Primary Care and Sports Medicine  [facility]  

## 2022-07-30 ENCOUNTER — Inpatient Hospital Stay: Payer: Self-pay | Attending: Medical-Surgical

## 2022-07-30 ENCOUNTER — Other Ambulatory Visit: Payer: Self-pay

## 2022-07-30 ENCOUNTER — Inpatient Hospital Stay (HOSPITAL_BASED_OUTPATIENT_CLINIC_OR_DEPARTMENT_OTHER): Payer: Self-pay | Admitting: Oncology

## 2022-07-30 VITALS — BP 151/60 | HR 107 | Temp 97.7°F | Resp 18 | Ht 72.0 in | Wt 191.1 lb

## 2022-07-30 DIAGNOSIS — Z79899 Other long term (current) drug therapy: Secondary | ICD-10-CM | POA: Insufficient documentation

## 2022-07-30 DIAGNOSIS — C61 Malignant neoplasm of prostate: Secondary | ICD-10-CM

## 2022-07-30 DIAGNOSIS — Z923 Personal history of irradiation: Secondary | ICD-10-CM | POA: Insufficient documentation

## 2022-07-30 DIAGNOSIS — R350 Frequency of micturition: Secondary | ICD-10-CM | POA: Insufficient documentation

## 2022-07-30 LAB — CMP (CANCER CENTER ONLY)
ALT: 15 U/L (ref 0–44)
AST: 18 U/L (ref 15–41)
Albumin: 4.4 g/dL (ref 3.5–5.0)
Alkaline Phosphatase: 48 U/L (ref 38–126)
Anion gap: 7 (ref 5–15)
BUN: 17 mg/dL (ref 8–23)
CO2: 28 mmol/L (ref 22–32)
Calcium: 10.4 mg/dL — ABNORMAL HIGH (ref 8.9–10.3)
Chloride: 100 mmol/L (ref 98–111)
Creatinine: 1.47 mg/dL — ABNORMAL HIGH (ref 0.61–1.24)
GFR, Estimated: 49 mL/min — ABNORMAL LOW (ref >60)
Glucose, Bld: 152 mg/dL — ABNORMAL HIGH (ref 70–99)
Potassium: 4.2 mmol/L (ref 3.5–5.1)
Sodium: 135 mmol/L (ref 135–145)
Total Bilirubin: 0.4 mg/dL (ref 0.3–1.2)
Total Protein: 7.5 g/dL (ref 6.5–8.1)

## 2022-07-30 LAB — CBC WITH DIFFERENTIAL (CANCER CENTER ONLY)
Abs Immature Granulocytes: 0.02 10*3/uL (ref 0.00–0.07)
Basophils Absolute: 0 10*3/uL (ref 0.0–0.1)
Basophils Relative: 0 %
Eosinophils Absolute: 0 10*3/uL (ref 0.0–0.5)
Eosinophils Relative: 0 %
HCT: 30.3 % — ABNORMAL LOW (ref 39.0–52.0)
Hemoglobin: 10.6 g/dL — ABNORMAL LOW (ref 13.0–17.0)
Immature Granulocytes: 1 %
Lymphocytes Relative: 26 %
Lymphs Abs: 1 10*3/uL (ref 0.7–4.0)
MCH: 29.3 pg (ref 26.0–34.0)
MCHC: 35 g/dL (ref 30.0–36.0)
MCV: 83.7 fL (ref 80.0–100.0)
Monocytes Absolute: 0.3 10*3/uL (ref 0.1–1.0)
Monocytes Relative: 8 %
Neutro Abs: 2.4 10*3/uL (ref 1.7–7.7)
Neutrophils Relative %: 65 %
Platelet Count: 146 10*3/uL — ABNORMAL LOW (ref 150–400)
RBC: 3.62 MIL/uL — ABNORMAL LOW (ref 4.22–5.81)
RDW: 13.1 % (ref 11.5–15.5)
WBC Count: 3.7 10*3/uL — ABNORMAL LOW (ref 4.0–10.5)
nRBC: 0 % (ref 0.0–0.2)

## 2022-07-30 NOTE — Progress Notes (Signed)
Hematology and Oncology Follow Up Visit  Caleb Robbins [id number] [birth year] 76 y.o. 07/30/2022 9:12 AM Caleb Robbins, Caleb Robbins, Joy, NP   Principle Diagnosis: 76 year old with localized prostate cancer presented with Gleason score of 8 and PSA of 122 in 2019.    Prior Therapy: He received definitive therapy with radiation started in November 2021.  He received 45 Gray in 25 fractions to the prostate seminal vesicles and lymph nodes and boosted up to 75 Gray total.  Therapy concluded in January 2022.    He was also receiving androgen deprivation therapy every 6 months started in July 2021.  He completed 2 years of therapy in 2023.  Last injection was given on November 01, 2021 for a total of 45 mg of Eligard.   Current therapy: Active surveillance.  Interim History: [name] returns today for a follow-up visit.  Since last visit, he reports no major changes in his health.  He continues to have urinary frequency but no hematuria or dysuria.  He denies any bone pain or pathological fractures.  He denies any hospitalizations or illnesses.  He continues to be active and attends to activities of daily living.  He denies any hospitalizations or illnesses.     Medications: I have reviewed the patient's current medications.  Current Outpatient Medications  Medication Sig Dispense Refill   allopurinol (ZYLOPRIM) 100 MG tablet Take 1 tablet (100 mg total) by mouth daily. 90 tablet 1   amLODipine (NORVASC) 10 MG tablet Take 1 tablet (10 mg total) by mouth daily. 90 tablet 1   atorvastatin (LIPITOR) 20 MG tablet Take 1 tablet (20 mg total) by mouth daily. 90 tablet 3   gabapentin (NEURONTIN) 300 MG capsule Take 1 capsule (300 mg total) by mouth 2 (two) times daily. 90 capsule 3   glimepiride (AMARYL) 2 MG tablet TAKE 1 TABLET BY MOUTH TWICE DAILY 180 tablet 1   hydrochlorothiazide (HYDRODIURIL) 25 MG tablet Take 0.5 tablets (12.5 mg total) by mouth daily. 15 tablet 1   ibuprofen (ADVIL) 800 MG  tablet Take 800 mg by mouth every 8 (eight) hours as needed for headache or moderate pain.     losartan (COZAAR) 100 MG tablet Take 1 tablet (100 mg total) by mouth daily. 90 tablet 1   metFORMIN (GLUCOPHAGE) 1000 MG tablet TAKE 1 TABLET BY MOUTH TWICE DAILY 180 tablet 1   Multiple Vitamin (MULTIVITAMIN WITH MINERALS) TABS tablet Take 1 tablet by mouth daily.     sildenafil (VIAGRA) 100 MG tablet Take 1 tablet (100 mg total) by mouth as needed. (Patient not taking: Reported on 07/29/2022) 10 tablet 1   tamsulosin (FLOMAX) 0.4 MG CAPS capsule Take 1 capsule (0.4 mg total) by mouth daily. 90 capsule 1   No current facility-administered medications for this visit.     Allergies: No Known Allergies    Physical Exam: Blood pressure (!) 151/60, pulse (!) 107, temperature 97.7 F (36.5 C), temperature source Temporal, resp. rate 18, height 6' (1.829 m), weight 191 lb 1.6 oz (86.7 kg), SpO2 99 %.   ECOG: 1     General appearance: Comfortable appearing without any discomfort Head: Normocephalic without any trauma Oropharynx: Mucous membranes are moist and pink without any thrush or ulcers. Eyes: Pupils are equal and round reactive to light. Lymph nodes: No cervical, supraclavicular, inguinal or axillary lymphadenopathy.   Heart:regular rate and rhythm.  S1 and S2 without leg edema. Lung: Clear without any rhonchi or wheezes.  No dullness to percussion. Abdomin: Soft, nontender,  nondistended with good bowel sounds.  No hepatosplenomegaly. Musculoskeletal: No joint deformity or effusion.  Full range of motion noted. Neurological: No deficits noted on motor, sensory and deep tendon reflex exam. Skin: No petechial rash or dryness.  Appeared moist.        Lab Results: Lab Results  Component Value Date   WBC 3.9 03/06/2022   HGB 10.7 (L) 03/06/2022   HCT 33.0 (L) 03/06/2022   MCV 88.9 03/06/2022   PLT 200 03/06/2022     Chemistry      Component Value Date/Time   NA 142  03/06/2022 0000   K 4.8 03/06/2022 0000   CL 104 03/06/2022 0000   CO2 23 03/06/2022 0000   BUN 15 03/06/2022 0000   CREATININE 1.48 (H) 03/06/2022 0000      Component Value Date/Time   CALCIUM 10.7 (H) 03/06/2022 0000   ALKPHOS 51 11/01/2021 0918   AST 16 03/06/2022 0000   AST 23 11/01/2021 0918   ALT 13 03/06/2022 0000   ALT 26 11/01/2021 0918   BILITOT 0.4 03/06/2022 0000   BILITOT 0.4 11/01/2021 0918         Impression and Plan:   76 year old man with:  1.  Prostate cancer diagnosed in 2019.  He presented with a Gleason score of 8 and PSA 122.    The natural course of his disease was reviewed at this time and treatment choices were discussed.  He is currently on active surveillance after receiving definitive therapy with radiation and androgen deprivation for localized disease.   The plan is to continue to monitor his PSA and reinstitute androgen deprivation therapy if his PSA starts to rise.  Therapy escalation with androgen receptor pathway inhibitors could also be included.   2.  Androgen deprivation therapy: No residual complications noted at this time.  He completed 2 years of therapy completed in January 2023.   3.  Urinary frequency: I recommended follow-up with urology regarding this issue.      4.  Follow-up: In 6 months for repeat evaluation.   30 minutes were spent on this visit.  The time was dedicated to reviewing his laboratory data, disease status update and outlining future plan of care review.   Zola Button, MD 10/18/20239:12 AM

## 2022-07-31 LAB — PROSTATE-SPECIFIC AG, SERUM (LABCORP): Prostate Specific Ag, Serum: 0.3 ng/mL (ref 0.0–4.0)

## 2022-09-01 ENCOUNTER — Telehealth: Payer: Self-pay | Admitting: Oncology

## 2022-09-01 NOTE — Telephone Encounter (Signed)
Informed patient regarding providers departure, patient has been rescheduled with new provider. Spoke with patient's daughter, patient will be notified.

## 2022-09-16 ENCOUNTER — Telehealth: Payer: Self-pay | Admitting: Medical-Surgical

## 2022-09-16 MED ORDER — HYDROCHLOROTHIAZIDE 25 MG PO TABS: 12.5000 mg | ORAL_TABLET | Freq: Every day | ORAL | 1 refills | Status: DC

## 2022-09-16 NOTE — Telephone Encounter (Signed)
Refill sent.

## 2022-09-16 NOTE — Telephone Encounter (Signed)
Pt is scheduled for his next F/u with pcp on 10/30/22 but he is out of his hydrochlorothiazide (HYDRODIURIL) 25 MG tablet  and is requesting a refill be sent. Thank you

## 2022-10-30 ENCOUNTER — Ambulatory Visit: Payer: Self-pay | Admitting: Medical-Surgical

## 2022-11-18 ENCOUNTER — Encounter: Payer: Self-pay | Admitting: Medical-Surgical

## 2022-11-18 ENCOUNTER — Ambulatory Visit (INDEPENDENT_AMBULATORY_CARE_PROVIDER_SITE_OTHER): Payer: Self-pay | Admitting: Medical-Surgical

## 2022-11-18 VITALS — BP 155/68 | HR 101 | Resp 20 | Ht 72.0 in | Wt 194.4 lb

## 2022-11-18 DIAGNOSIS — E785 Hyperlipidemia, unspecified: Secondary | ICD-10-CM

## 2022-11-18 DIAGNOSIS — N529 Male erectile dysfunction, unspecified: Secondary | ICD-10-CM

## 2022-11-18 DIAGNOSIS — I10 Essential (primary) hypertension: Secondary | ICD-10-CM

## 2022-11-18 DIAGNOSIS — E1165 Type 2 diabetes mellitus with hyperglycemia: Secondary | ICD-10-CM

## 2022-11-18 DIAGNOSIS — E1169 Type 2 diabetes mellitus with other specified complication: Secondary | ICD-10-CM

## 2022-11-18 LAB — POCT GLYCOSYLATED HEMOGLOBIN (HGB A1C): Hemoglobin A1C: 6.2 % — AB (ref 4.0–5.6)

## 2022-11-18 MED ORDER — ALLOPURINOL 100 MG PO TABS: 100.0000 mg | ORAL_TABLET | Freq: Every day | ORAL | 1 refills | Status: DC

## 2022-11-18 MED ORDER — CHLORTHALIDONE 25 MG PO TABS: 25.0000 mg | ORAL_TABLET | Freq: Every day | ORAL | 0 refills | Status: DC

## 2022-11-18 MED ORDER — LOSARTAN POTASSIUM 100 MG PO TABS: 100.0000 mg | ORAL_TABLET | Freq: Every day | ORAL | 1 refills | Status: DC

## 2022-11-18 MED ORDER — SILDENAFIL CITRATE 50 MG PO TABS: 50.0000 mg | ORAL_TABLET | Freq: Every day | ORAL | 0 refills | Status: DC | PRN

## 2022-11-18 NOTE — Progress Notes (Signed)
Established Patient Office Visit  Subjective   Patient ID: Caleb Robbins, male   DOB: 11-10-1945 Age: 77 y.o. MRN: [id number]   Chief Complaint  Patient presents with   Follow-up   Medication Refill   Hypertension    HPI Pleasant 77 year old male presenting today for the following:  Hypertension: Has been monitoring blood pressures at home reliably.  Unfortunately, his blood pressures have been somewhat elevated.  The lowest systolic seen was 329 but the highest has been in the 150s-160s.  His diastolic numbers are usually in the 70s-80s.  He has been taking amlodipine 10 mg, losartan 100 mg, and hydrochlorothiazide 25 mg daily.  Tolerating all medications well without side effects.  Blood pressure on arrival elevated today.  Following low-sodium diet.  No regular intentional exercise. Denies CP, SOB, palpitations, lower extremity edema, dizziness, headaches, or vision changes.  Diabetes: Checks his blood sugar and keeps a log of his readings.  Overall has been doing well however recently has begun to develop some hypoglycemic episodes with the lowest reading of 52.  Reports that he is asymptomatic for the most part during these hypoglycemic episodes.  Taking glimepiride 2 mg twice daily and metformin 1000 mg twice daily as prescribed.  Tolerating both medications well without side effects.  No changes in his overall eating habits.  Hyperlipidemia: Taking atorvastatin 20 mg daily, tolerating well without side effects.  Erectile dysfunction: Previously prescribed sildenafil however he reports he never picked this medication up.  He is very worried about erectile dysfunction as he seems unable to achieve an erection at all now.   Reports he will be leaving for [place] on 11/25/22 and will be gone for 6 months. He does not have a doctor in [place] but reports he will be able to find one while he is there.   Professional interpreter, Marcello Moores, served as Optometrist for this visit.    Objective:     Vitals:   11/18/22 1358 11/18/22 1423  BP: (!) 180/77 (!) 155/68  Pulse: (!) 112 (!) 101  Resp: 20   Height: 6' (1.829 m)   Weight: 194 lb 6.4 oz (88.2 kg)   SpO2: 99% 99%  BMI (Calculated): 26.36     Physical Exam Vitals reviewed.  Constitutional:      General: He is not in acute distress.    Appearance: Normal appearance. He is obese. He is not ill-appearing.  HENT:     Head: Normocephalic and atraumatic.  Cardiovascular:     Rate and Rhythm: Normal rate and regular rhythm.     Pulses: Normal pulses.     Heart sounds: Normal heart sounds. No murmur heard.    No friction rub. No gallop.  Pulmonary:     Effort: Pulmonary effort is normal. No respiratory distress.     Breath sounds: Normal breath sounds.  Skin:    General: Skin is warm and dry.  Neurological:     Mental Status: He is alert and oriented to person, place, and time.  Psychiatric:        Mood and Affect: Mood normal.        Behavior: Behavior normal.        Thought Content: Thought content normal.        Judgment: Judgment normal.    Results for orders placed or performed in visit on 11/18/22 (from the past 24 hour(s))  POCT HgB A1C     Status: Abnormal   Collection Time: 11/18/22  2:04 PM  Result Value  Ref Range   Hemoglobin A1C 6.2 (A) 4.0 - 5.6 %   HbA1c POC (<> result, manual entry)     HbA1c, POC (prediabetic range)     HbA1c, POC (controlled diabetic range)         The ASCVD Risk score (Arnett DK, et al., 2019) failed to calculate for the following reasons:   The valid total cholesterol range is 130 to 320 mg/dL   Assessment & Plan:   1. Type 2 diabetes mellitus with hyperglycemia, without long-term current use of insulin (HCC) POCT A1c at 6.2% today indicating good control. Concern with recent hypoglycemic episodes. Continue Metformin 1g BID. Continue Glimepiride '2mg'$  every morning but reduce evening dose to '1mg'$ . Continue to monitor blood sugars at home with a fasting goal of 120 or less.  -  POCT HgB A1C  2. Essential hypertension Blood pressure elevated on arrival and recheck was still elevated above goal.  Since he has been running high at home, we will plan to continue 10 mg amlodipine daily and 100 mg of losartan daily.  Discontinue hydrochlorothiazide.  Adding chlorthalidone 25 mg daily.  Continue monitor blood pressure at home with a goal for readings of 130/80 or less.  Low-sodium diet.  Increase activity as tolerated.  Strongly advised to find a provider in [place] to follow-up on his blood pressure and recheck his BMP within the first 1 to 2 weeks of his arrival.  Patient verbalized understanding and is agreeable to the plan.  3. Hyperlipidemia associated with type 2 diabetes mellitus (HCC) Continue Lipitor 20 mg daily.  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  Sending in sildenafil 50 mg daily as needed to see if this is helpful for erectile dysfunction.  If not, we may need to try a higher dose or switching to tadalafil.  At that point, if no medication seems to help, we can refer him to urology for further discussion of alternative options.  Return in about 6 months (around 05/19/2023) for chronic disease follow up.  ___________________________________________ Clearnce Sorrel, DNP, APRN, FNP-BC Primary Care and [place]

## 2022-11-18 NOTE — Patient Instructions (Addendum)
STOP hydrochlorothiazide!!  START Chlorthalidone '25mg'$  daily.  Continue Losartan and Amlodipine with no changes.  Take Gimepiride '2mg'$  every morning but reduce evening dose to '1mg'$  (1/2 tablet).   Continue to check blood pressures and sugars at home.   See a doctor in [place] within 2 weeks of getting there if possible. Will need to check kidney function and electrolytes on the new medication.

## 2023-01-29 ENCOUNTER — Inpatient Hospital Stay: Payer: Self-pay

## 2023-01-29 ENCOUNTER — Inpatient Hospital Stay: Payer: Self-pay | Attending: Oncology | Admitting: Medical Oncology

## 2023-01-29 ENCOUNTER — Other Ambulatory Visit: Payer: Self-pay

## 2023-01-29 ENCOUNTER — Ambulatory Visit: Payer: Self-pay | Admitting: Oncology

## 2023-05-19 ENCOUNTER — Telehealth: Payer: Self-pay

## 2023-05-19 ENCOUNTER — Ambulatory Visit: Payer: Self-pay | Admitting: Medical-Surgical

## 2023-05-19 MED ORDER — LOSARTAN POTASSIUM 100 MG PO TABS: 100.0000 mg | ORAL_TABLET | Freq: Every day | ORAL | 0 refills | Status: DC

## 2023-05-19 MED ORDER — AMLODIPINE BESYLATE 10 MG PO TABS: 10.0000 mg | ORAL_TABLET | Freq: Every day | ORAL | 0 refills | Status: DC

## 2023-05-19 MED ORDER — TAMSULOSIN HCL 0.4 MG PO CAPS: 0.4000 mg | ORAL_CAPSULE | Freq: Every day | ORAL | 0 refills | Status: DC

## 2023-05-19 MED ORDER — GLIMEPIRIDE 2 MG PO TABS: ORAL_TABLET | ORAL | 0 refills | Status: DC

## 2023-05-19 MED ORDER — CHLORTHALIDONE 25 MG PO TABS: 25.0000 mg | ORAL_TABLET | Freq: Every day | ORAL | 0 refills | Status: DC

## 2023-05-19 MED ORDER — ALLOPURINOL 100 MG PO TABS: 100.0000 mg | ORAL_TABLET | Freq: Every day | ORAL | 0 refills | Status: DC

## 2023-05-19 MED ORDER — ATORVASTATIN CALCIUM 20 MG PO TABS: 20.0000 mg | ORAL_TABLET | Freq: Every day | ORAL | 0 refills | Status: DC

## 2023-05-19 NOTE — Telephone Encounter (Signed)
30-day refills sent to the pharmacy on file to get him to his upcoming appointment.

## 2023-05-19 NOTE — Telephone Encounter (Signed)
Left message for a return call

## 2023-05-19 NOTE — Telephone Encounter (Signed)
Patient scheduled for  05/21/2022 for sick symptoms with Christen Butter, NP

## 2023-05-19 NOTE — Telephone Encounter (Signed)
Patient had appointment scheduled today but arrived late, patient is rescheduled for 8/15, patients daughter states that patient is out of all of his medications, please advise, thanks.

## 2023-05-20 ENCOUNTER — Ambulatory Visit (INDEPENDENT_AMBULATORY_CARE_PROVIDER_SITE_OTHER): Payer: BLUE CROSS/BLUE SHIELD | Admitting: Medical-Surgical

## 2023-05-20 ENCOUNTER — Encounter: Payer: Self-pay | Admitting: Medical-Surgical

## 2023-05-20 VITALS — BP 173/83 | HR 62 | Ht 72.0 in | Wt 193.1 lb

## 2023-05-20 DIAGNOSIS — R051 Acute cough: Secondary | ICD-10-CM | POA: Diagnosis not present

## 2023-05-20 DIAGNOSIS — E1169 Type 2 diabetes mellitus with other specified complication: Secondary | ICD-10-CM | POA: Diagnosis not present

## 2023-05-20 DIAGNOSIS — E785 Hyperlipidemia, unspecified: Secondary | ICD-10-CM

## 2023-05-20 DIAGNOSIS — E1165 Type 2 diabetes mellitus with hyperglycemia: Secondary | ICD-10-CM

## 2023-05-20 DIAGNOSIS — Z7984 Long term (current) use of oral hypoglycemic drugs: Secondary | ICD-10-CM

## 2023-05-20 DIAGNOSIS — N289 Disorder of kidney and ureter, unspecified: Secondary | ICD-10-CM

## 2023-05-20 DIAGNOSIS — G629 Polyneuropathy, unspecified: Secondary | ICD-10-CM

## 2023-05-20 DIAGNOSIS — M109 Gout, unspecified: Secondary | ICD-10-CM

## 2023-05-20 DIAGNOSIS — I10 Essential (primary) hypertension: Secondary | ICD-10-CM | POA: Diagnosis not present

## 2023-05-20 LAB — POCT INFLUENZA A/B
Influenza A, POC: NEGATIVE
Influenza B, POC: NEGATIVE

## 2023-05-20 LAB — POCT RAPID STREP A (OFFICE): Rapid Strep A Screen: NEGATIVE

## 2023-05-20 LAB — POC COVID19 BINAXNOW: SARS Coronavirus 2 Ag: POSITIVE — AB

## 2023-05-20 MED ORDER — AMLODIPINE BESYLATE 10 MG PO TABS: 10.0000 mg | ORAL_TABLET | Freq: Every day | ORAL | 3 refills | Status: DC

## 2023-05-20 MED ORDER — SILDENAFIL CITRATE 50 MG PO TABS: 50.0000 mg | ORAL_TABLET | Freq: Every day | ORAL | 11 refills | Status: DC | PRN

## 2023-05-20 MED ORDER — ALLOPURINOL 100 MG PO TABS: 100.0000 mg | ORAL_TABLET | Freq: Every day | ORAL | 3 refills | Status: DC

## 2023-05-20 MED ORDER — GABAPENTIN 100 MG PO CAPS: 100.0000 mg | ORAL_CAPSULE | Freq: Three times a day (TID) | ORAL | 3 refills | Status: DC

## 2023-05-20 MED ORDER — GLIMEPIRIDE 2 MG PO TABS: ORAL_TABLET | ORAL | 3 refills | Status: DC

## 2023-05-20 MED ORDER — LOSARTAN POTASSIUM 100 MG PO TABS: 100.0000 mg | ORAL_TABLET | Freq: Every day | ORAL | 3 refills | Status: DC

## 2023-05-20 MED ORDER — NIRMATRELVIR/RITONAVIR (PAXLOVID)TABLET: 3.0000 | ORAL_TABLET | Freq: Two times a day (BID) | ORAL | 0 refills | Status: DC

## 2023-05-20 MED ORDER — TAMSULOSIN HCL 0.4 MG PO CAPS: 0.4000 mg | ORAL_CAPSULE | Freq: Every day | ORAL | 3 refills | Status: DC

## 2023-05-20 MED ORDER — CHLORTHALIDONE 25 MG PO TABS: 25.0000 mg | ORAL_TABLET | Freq: Every day | ORAL | 3 refills | Status: DC

## 2023-05-20 MED ORDER — ATORVASTATIN CALCIUM 20 MG PO TABS: 20.0000 mg | ORAL_TABLET | Freq: Every day | ORAL | 3 refills | Status: DC

## 2023-05-20 MED ORDER — METFORMIN HCL 1000 MG PO TABS: ORAL_TABLET | ORAL | 3 refills | Status: DC

## 2023-05-20 NOTE — Progress Notes (Signed)
        Established patient visit  History, exam, impression, and plan:  1. Acute cough Pleasant 77 year old male presenting today for evaluation of acute cough and generalized malaise.  He notes that he has also been very fatigued and has had several nosebleeds.  Has been around sick family members and recently returned from his stay in [place].  Denies fever, chills, and chest pain.  Endorses some shortness of breath.  No GI symptoms.  Negative for flu and strep.  POCT COVID positive.  Adding Paxlovid twice daily x 5 days.  Recommend conservative measures for symptom management.  While taking Paxlovid, hold Lipitor. - POCT rapid strep A - POCT Influenza A/B - POC COVID-19 - nirmatrelvir/ritonavir (PAXLOVID) 20 x 150 MG & 10 x 100MG  TABS; Take 3 tablets by mouth 2 (two) times daily for 5 days. (Take nirmatrelvir 150 mg two tablets twice daily for 5 days and ritonavir 100 mg one tablet twice daily for 5 days) Patient GFR is 49  Dispense: 30 tablet; Refill: 0  2. Type 2 diabetes mellitus with hyperglycemia, without long-term current use of insulin (HCC) History of type 2 diabetes currently managed with metformin 1000 mg twice daily and glimepiride 2 mg twice daily monitoring glucose at home with fasting readings at goal.  Checking hemoglobin A1c today.  Continue metformin and glimepiride as prescribed. - Hemoglobin A1c  3. Gouty arthritis Currently taking allopurinol 100 mg daily which keeps symptoms and check.  No recent flares.  Continue allopurinol as prescribed.  4. Neuropathy Unfortunately, has lost quite a bit of sensation in his feet bilaterally.  Suspect this is related to diabetic neuropathy.  Recommend continuing to monitor glucose and making sure this is well-controlled.  Adding gabapentin 100 mg 3 times daily.  5. Essential hypertension History of hypertension previously managed with amlodipine 10 mg daily and losartan 100 mg daily.  Checking blood pressure at home and notes that his  home readings have been higher than goal.  On evaluation of his medicine bottles, he has been taking losartan as prescribed however his amlodipine bottle is empty.  Notes that has been empty "a while".  Blood pressure elevated on arrival today.  Checking labs as below.  Restart amlodipine and continue losartan. - CBC with Differential/Platelet - Comprehensive metabolic panel - Lipid panel  6. Hyperlipidemia associated with type 2 diabetes mellitus (HCC) History of hyperlipidemia currently treated with Lipitor 20 mg daily.  Tolerating the medication well without side effects.  Following a low-fat heart healthy diet.  Minimal exercise noted.  Checking labs as below.  Hold Lipitor while taking Paxlovid then resume 20 mg daily as prescribed. - Comprehensive metabolic panel - Lipid panel  7. Abnormal kidney function History of abnormal kidney function.  Rechecking CMP today.   Procedures performed this visit: None.  Return in about 6 months (around 11/20/2023) for chronic disease follow up.  __________________________________ Thayer Ohm, DNP, APRN, FNP-BC Primary Care and Sports Medicine [facility] [place]

## 2023-05-21 ENCOUNTER — Telehealth: Payer: Self-pay | Admitting: Medical-Surgical

## 2023-05-21 MED ORDER — NIRMATRELVIR/RITONAVIR (PAXLOVID) TABLET (RENAL DOSING): 2.0000 | ORAL_TABLET | Freq: Two times a day (BID) | ORAL | 0 refills | Status: AC

## 2023-05-21 NOTE — Telephone Encounter (Signed)
Patient daughter called in stating that the medication prescribed on 05/18/23 :allopurinol  amLODipine  atorvastatin  chlorthalidone  gabapentin  glimepiride  losartan  metFORMIN  nirmatrelvir/ritonavir  sildenafil  tamsulosin were not covered by insurance and would cost $1,500. Requesting different medications.  [facility] [address] [place]

## 2023-05-21 NOTE — Telephone Encounter (Signed)
Renal dose Paxlovid sent to the pharmacy. ___________________________________________ Thayer Ohm, DNP, APRN, FNP-BC Primary Care and Sports Medicine [facility] [place]

## 2023-05-21 NOTE — Telephone Encounter (Signed)
Left message advising patient of prescription.  ?

## 2023-05-21 NOTE — Telephone Encounter (Signed)
Contacted the pharmacy and they need clarification if he needs the Renal pack of Paxlovid because his GFR is 49.  His pharmacy didn't even have his insurance on file. He has been filing Good Rx this whole time. The pharmacy now has his insurance updated and his prescriptions are ready for pick up. They just need clarification on the paxlovid.

## 2023-05-28 ENCOUNTER — Ambulatory Visit: Payer: Self-pay | Admitting: Medical-Surgical

## 2023-06-24 ENCOUNTER — Telehealth: Payer: Self-pay | Admitting: Medical-Surgical

## 2023-06-24 MED ORDER — LOSARTAN POTASSIUM 100 MG PO TABS: 100.0000 mg | ORAL_TABLET | Freq: Every day | ORAL | 0 refills | Status: DC

## 2023-06-24 MED ORDER — METFORMIN HCL 1000 MG PO TABS: ORAL_TABLET | ORAL | 0 refills | Status: DC

## 2023-06-24 MED ORDER — GLIMEPIRIDE 2 MG PO TABS: ORAL_TABLET | ORAL | 0 refills | Status: DC

## 2023-06-24 MED ORDER — ALLOPURINOL 100 MG PO TABS: 100.0000 mg | ORAL_TABLET | Freq: Every day | ORAL | 0 refills | Status: DC

## 2023-06-24 MED ORDER — AMLODIPINE BESYLATE 10 MG PO TABS: 10.0000 mg | ORAL_TABLET | Freq: Every day | ORAL | 0 refills | Status: DC

## 2023-06-24 MED ORDER — CHLORTHALIDONE 25 MG PO TABS: 25.0000 mg | ORAL_TABLET | Freq: Every day | ORAL | 0 refills | Status: DC

## 2023-06-24 MED ORDER — ATORVASTATIN CALCIUM 20 MG PO TABS: 20.0000 mg | ORAL_TABLET | Freq: Every day | ORAL | 0 refills | Status: DC

## 2023-06-24 MED ORDER — TAMSULOSIN HCL 0.4 MG PO CAPS: 0.4000 mg | ORAL_CAPSULE | Freq: Every day | ORAL | 0 refills | Status: DC

## 2023-06-24 MED ORDER — GABAPENTIN 100 MG PO CAPS: 100.0000 mg | ORAL_CAPSULE | Freq: Three times a day (TID) | ORAL | 0 refills | Status: DC

## 2023-06-24 NOTE — Telephone Encounter (Signed)
Patient's family called requesting a 6 month refill on all of his prescriptions.

## 2023-06-24 NOTE — Telephone Encounter (Signed)
Sent refills to the preferred pharmacy

## 2023-07-01 ENCOUNTER — Telehealth: Payer: Self-pay | Admitting: General Practice

## 2023-07-01 NOTE — Transitions of Care (Post Inpatient/ED Visit) (Signed)
07/01/2023  Name: Alter Supino MRN: [id number] DOB: 11-03-1945  Today's TOC FU Call Status: Today's TOC FU Call Status:: Unsuccessful Call (1st Attempt) Unsuccessful Call (1st Attempt) Date: 07/01/23  Attempted to reach the patient regarding the most recent Inpatient/ED visit.  Follow Up Plan: Additional outreach attempts will be made to reach the patient to complete the Transitions of Care (Post Inpatient/ED visit) call.   Signature [place] Charon, Control and instrumentation engineer

## 2023-07-06 NOTE — Transitions of Care (Post Inpatient/ED Visit) (Signed)
07/06/2023  Name: Caleb Robbins MRN: [id number] DOB: 10-17-1945  Today's TOC FU Call Status: Today's TOC FU Call Status:: Unsuccessful Call (2nd Attempt) Unsuccessful Call (1st Attempt) Date: 07/01/23 Unsuccessful Call (2nd Attempt) Date: 07/06/23  Attempted to reach the patient regarding the most recent Inpatient/ED visit.  Follow Up Plan: Additional outreach attempts will be made to reach the patient to complete the Transitions of Care (Post Inpatient/ED visit) call.   Signature [place] Charon, Control and instrumentation engineer

## 2023-07-07 ENCOUNTER — Encounter: Payer: Self-pay | Admitting: Medical-Surgical

## 2023-07-07 ENCOUNTER — Ambulatory Visit (INDEPENDENT_AMBULATORY_CARE_PROVIDER_SITE_OTHER): Payer: BLUE CROSS/BLUE SHIELD | Admitting: Medical-Surgical

## 2023-07-07 VITALS — BP 133/66 | HR 72 | Resp 20 | Ht 72.0 in | Wt 197.6 lb

## 2023-07-07 DIAGNOSIS — M79642 Pain in left hand: Secondary | ICD-10-CM | POA: Diagnosis not present

## 2023-07-07 DIAGNOSIS — M79641 Pain in right hand: Secondary | ICD-10-CM | POA: Diagnosis not present

## 2023-07-07 DIAGNOSIS — Z09 Encounter for follow-up examination after completed treatment for conditions other than malignant neoplasm: Secondary | ICD-10-CM

## 2023-07-07 MED ORDER — TRAMADOL HCL 50 MG PO TABS
50.0000 mg | ORAL_TABLET | Freq: Three times a day (TID) | ORAL | 0 refills | Status: AC | PRN
Start: 2023-07-07 — End: 2023-07-12

## 2023-07-07 NOTE — Progress Notes (Signed)
        Established patient visit  History, exam, impression, and plan:  1. Hospital discharge follow-up 2. Bilateral hand pain Pleasant 77 year old male presenting today for hospital discharge follow-up after experiencing a fall when he was running after his grandchildren.  Notes that someone stepped on his shoe and he fell to the ground landing on both hands.  He was evaluated at the ED on 06/30/2023 where they performed x-rays and imaging.  No fractures or acute injuries were identified.  He has abrasions on all of the knuckles of the bilateral hands.  He has some covered and Band-Aids and antibiotic ointment on others.  Notes that his hands hurt terribly and are interfering with his ability to sleep.  He had a few pills for pain that from the ED however he is now out.  He has been taking Tylenol but this is not helpful for his pain.  Discussed recommendations for pain management.  Advised that he can soak his hands in warm Epsom salt water 3-4 times daily which should help with healing as well as pain management.  Okay to use Tylenol if needed.  Sending in a short supply of tramadol and a letter was provided to allow him to travel with this.  Notes that he will be leaving tomorrow to go to [place] for 6 months and has his medications already filled for 27-month supply.  Recommend he come back for follow-up upon return.  Interpretation assisted with professional interpreter Rulon Eisenmenger [phone].  Procedures performed this visit: None.  Return in about 6 months (around 01/04/2024) for chronic disease follow up.  __________________________________ Thayer Ohm, DNP, APRN, FNP-BC Primary Care and Sports Medicine [facility]

## 2023-07-07 NOTE — Transitions of Care (Post Inpatient/ED Visit) (Signed)
07/07/2023  Name: August Roginski MRN: [id number] DOB: 12/04/1945  Today's TOC FU Call Status: Today's TOC FU Call Status:: Successful TOC FU Call Completed Unsuccessful Call (1st Attempt) Date: 07/01/23 Unsuccessful Call (2nd Attempt) Date: 07/06/23 [facility] FU Call Complete Date: 07/07/23 Patient's Name and Date of Birth confirmed.  Transition Care Management Follow-up Telephone Call Date of Discharge: 06/30/23 Discharge Facility: Other ([facility]) Name of Other ([facility]) Discharge Facility: [facility] Type of Discharge: Emergency Department Reason for ED Visit: Other: (mulitiple abraison)  Patient had office visit with PCP today.   SIGNATURE Modesto Charon, RN BSN Nurse Health Advisor

## 2023-07-21 ENCOUNTER — Ambulatory Visit: Payer: BLUE CROSS/BLUE SHIELD | Admitting: Medical-Surgical

## 2023-11-20 ENCOUNTER — Ambulatory Visit: Payer: BLUE CROSS/BLUE SHIELD | Admitting: Medical-Surgical

## 2023-12-25 ENCOUNTER — Ambulatory Visit: Payer: Self-pay | Admitting: Medical-Surgical

## 2023-12-25 NOTE — Telephone Encounter (Signed)
 Copied from CRM [id number]. Topic: Clinical - Red Word Triage >> Dec 25, 2023  2:51 PM Shon Hale wrote: Red Word that prompted transfer to Nurse Triage: Both feet are swollen and vision may be blurry.    Chief Complaint: Swollen feet Symptoms: Swollen feet, blurry vision Pertinent Negatives: Patient denies SOB, foot pain, fever Disposition: [] ED /[] Urgent Care (no appt availability in office) / [x] Appointment(In office/virtual)/ []  [facility]/ [] Home Care/ [] Refused Recommended Disposition /[] [facility]/ []  Follow-up with PCP Additional Notes: Patient's daughter Claris Che called the office with patient also present. Patient has been experiencing blurry vision and right foot swelling for a year and has been seen for these problems before. There hasn't been any changes with the vision and right foot swelling. Patient just started having swelling in the left foot yesterday. He denies pain. Patient is needing appointment with new provider in the [facility] area since he has moved. Appointment scheduled on Monday with different provider at new office location.  Reason for Disposition  MILD or MODERATE ankle swelling (e.g., can't move joint normally, can't do usual activities) (Exceptions: Itchy, localized swelling; swelling is chronic.)  Answer Assessment - Initial Assessment Questions 1. LOCATION: "Which ankle is swollen?" "Where is the swelling?"     Both feet, right is more swollen  2. ONSET: "When did the swelling start?"     R foot has been swollen for a year, left foot swelling started yesterday  3. SWELLING: "How bad is the swelling?" Or, "How large is it?" (e.g., mild, moderate, severe; size of localized swelling)    - NONE: No joint swelling.   - LOCALIZED: Localized; small area of puffy or swollen skin (e.g., insect bite, skin irritation).   - MILD: Joint looks or feels mildly swollen or puffy.   - MODERATE: Swollen; interferes with normal activities (e.g., work or  school); decreased range of movement; may be limping.   - SEVERE: Very swollen; can't move swollen joint at all; limping a lot or unable to walk.     Puffy, but patient can walk okay  4. PAIN: "Is there any pain?" If Yes, ask: "How bad is it?" (Scale 1-10; or mild, moderate, severe)   - NONE (0): no pain.   - MILD (1-3): doesn't interfere with normal activities.    - MODERATE (4-7): interferes with normal activities (e.g., work or school) or awakens from sleep, limping.    - SEVERE (8-10): excruciating pain, unable to do any normal activities, unable to walk.      No pain   5. OTHER SYMPTOMS: "Do you have any other symptoms?" (e.g., fever, chest pain, difficulty breathing, calf pain)     No  Protocols used: Ankle Swelling-A-AH

## 2023-12-28 ENCOUNTER — Encounter: Payer: Self-pay | Admitting: Family Medicine

## 2023-12-28 ENCOUNTER — Ambulatory Visit (INDEPENDENT_AMBULATORY_CARE_PROVIDER_SITE_OTHER): Admitting: Family Medicine

## 2023-12-28 VITALS — BP 137/83 | HR 72 | Temp 97.7°F | Ht 67.72 in | Wt 205.2 lb

## 2023-12-28 DIAGNOSIS — E785 Hyperlipidemia, unspecified: Secondary | ICD-10-CM

## 2023-12-28 DIAGNOSIS — Z7984 Long term (current) use of oral hypoglycemic drugs: Secondary | ICD-10-CM

## 2023-12-28 DIAGNOSIS — E1169 Type 2 diabetes mellitus with other specified complication: Secondary | ICD-10-CM

## 2023-12-28 DIAGNOSIS — I10 Essential (primary) hypertension: Secondary | ICD-10-CM | POA: Diagnosis not present

## 2023-12-28 DIAGNOSIS — R600 Localized edema: Secondary | ICD-10-CM | POA: Diagnosis not present

## 2023-12-28 DIAGNOSIS — E1165 Type 2 diabetes mellitus with hyperglycemia: Secondary | ICD-10-CM

## 2023-12-28 DIAGNOSIS — N1832 Chronic kidney disease, stage 3b: Secondary | ICD-10-CM

## 2023-12-28 DIAGNOSIS — Z7689 Persons encountering health services in other specified circumstances: Secondary | ICD-10-CM

## 2023-12-28 DIAGNOSIS — R399 Unspecified symptoms and signs involving the genitourinary system: Secondary | ICD-10-CM

## 2023-12-28 LAB — POCT GLYCOSYLATED HEMOGLOBIN (HGB A1C)
HbA1c POC (<> result, manual entry): 0 % (ref 4.0–5.6)
HbA1c, POC (controlled diabetic range): 0 % (ref 0.0–7.0)
HbA1c, POC (prediabetic range): 5.7 % (ref 5.7–6.4)
HbA1c, POC (prediabetic range): 5.7 % — AB (ref 5.7–6.4)
Hemoglobin A1C: 0 % — AB (ref 4.0–5.6)

## 2023-12-28 MED ORDER — METFORMIN HCL 1000 MG PO TABS: ORAL_TABLET | ORAL | 1 refills | Status: DC

## 2023-12-28 MED ORDER — GABAPENTIN 100 MG PO CAPS
100.0000 mg | ORAL_CAPSULE | Freq: Three times a day (TID) | ORAL | 5 refills | Status: DC
Start: 2023-12-28 — End: 2024-03-10

## 2023-12-28 MED ORDER — TAMSULOSIN HCL 0.4 MG PO CAPS: 0.4000 mg | ORAL_CAPSULE | Freq: Every day | ORAL | 3 refills | Status: DC

## 2023-12-28 MED ORDER — ATORVASTATIN CALCIUM 20 MG PO TABS: 20.0000 mg | ORAL_TABLET | Freq: Every day | ORAL | 3 refills | Status: DC

## 2023-12-28 NOTE — Progress Notes (Addendum)
 New Patient Office Visit  Subjective   Patient ID: Caleb Robbins, male    DOB: 1946-02-13  Age: 78 y.o. MRN: [id number]  CC:  Chief Complaint  Patient presents with   Establish Care    Pt. Here to establish care.    Foot Swelling    Pt. C/o bilateral foot swelling that's been going on since a year.    Blurred Vision    Pt. Stats of blur vision with no pain that's been going on for 2 years. Pt. Also stats of Lt eye surgery two years ago.    HPI Caleb Robbins is a 78 year old male who presents to establish with [facility]. He is accompanied by his daughter, who is translating.  CC: Patient here to establish care  Last PCP: Christen Butter, FNP at [facility] Specialists: oncology   Bilateral foot swelling: has been occurring for over a year  Just came from [place]- has been a week since the swelling  Wearing compression socks, bilateral swelling but more so R>L Denies erythema, warmth, pain.   HTN: amlodipine 10mg  & losartan 100mg  & chlorthalidone 25mg  daily  BP readings at home: 170/92, 163/78, 163/78  DM: metformin 1000mg  BID & glimepiride 2mg  (2 tabs in AM & 1 tab in PM)  Gabapentin 100mg  TID  05/20/2023: A1c at 7.5%  Fasting BGL: 49, 31, 27, 43, 79, 43  HLD: atorvastatin 20mg  daily   LUTS: Flomax 0.4mg  daily   Gouty arthritis: allopurinol 100mg  daily   Had surgery about 2 years ago for L cataract  Taking lantanoprost & brimonidine/timolol in both eyes in   Prostate cancer- Dx in 2019 Therapy finished in Jan 2022  Outpatient Encounter Medications as of 12/28/2023  Medication Sig   allopurinol (ZYLOPRIM) 100 MG tablet Take 1 tablet (100 mg total) by mouth daily.   amLODipine (NORVASC) 10 MG tablet Take 1 tablet (10 mg total) by mouth daily.   brimonidine-timolol (COMBIGAN) 0.2-0.5 % ophthalmic solution Place 1 drop into both eyes daily.   chlorthalidone (HYGROTON) 25 MG tablet Take 1 tablet (25 mg total) by mouth daily.   ibuprofen (ADVIL) 800 MG  tablet Take 800 mg by mouth every 8 (eight) hours as needed for headache or moderate pain.   latanoprost (XALATAN) 0.005 % ophthalmic solution Place 1 drop into both eyes at bedtime.   losartan (COZAAR) 100 MG tablet Take 1 tablet (100 mg total) by mouth daily.   Multiple Vitamin (MULTIVITAMIN WITH MINERALS) TABS tablet Take 1 tablet by mouth daily.   sildenafil (VIAGRA) 50 MG tablet Take 1 tablet (50 mg total) by mouth daily as needed for erectile dysfunction.   [DISCONTINUED] atorvastatin (LIPITOR) 20 MG tablet Take 1 tablet (20 mg total) by mouth daily.   [DISCONTINUED] gabapentin (NEURONTIN) 100 MG capsule Take 1 capsule (100 mg total) by mouth 3 (three) times daily.   [DISCONTINUED] glimepiride (AMARYL) 2 MG tablet TAKE 1 TABLET BY MOUTH TWICE DAILY   [DISCONTINUED] metFORMIN (GLUCOPHAGE) 1000 MG tablet TAKE 1 TABLET BY MOUTH TWICE DAILY   [DISCONTINUED] tamsulosin (FLOMAX) 0.4 MG CAPS capsule Take 1 capsule (0.4 mg total) by mouth daily.   atorvastatin (LIPITOR) 20 MG tablet Take 1 tablet (20 mg total) by mouth daily.   gabapentin (NEURONTIN) 100 MG capsule Take 1 capsule (100 mg total) by mouth 3 (three) times daily.   metFORMIN (GLUCOPHAGE) 1000 MG tablet TAKE 1 TABLET BY MOUTH TWICE DAILY   tamsulosin (FLOMAX) 0.4 MG CAPS capsule Take 1 capsule (0.4  mg total) by mouth daily.   No facility-administered encounter medications on file as of 12/28/2023.    Patient Active Problem List   Diagnosis Date Noted   Obesity (BMI 30.0-34.9) 07/10/2022   Lower urinary tract symptoms (LUTS) 07/10/2022   Hyperlipidemia associated with type 2 diabetes mellitus (HCC) 07/10/2022   Gouty arthritis 03/06/2022   Type 2 diabetes mellitus with hyperglycemia, without long-term current use of insulin (HCC) 03/05/2022   ED (erectile dysfunction) of organic origin 10/17/2020   Essential hypertension 10/17/2020   Hyperglycemia due to type 2 diabetes mellitus (HCC) 10/17/2020   Malignant neoplasm of prostate  (HCC) 09/20/2018   Past Medical History:  Diagnosis Date   Diabetes mellitus without complication (HCC)    Hypertension    Prostate cancer [facility])    Past Surgical History:  Procedure Laterality Date   GOLD SEED IMPLANT N/A 08/15/2020   Procedure: GOLD SEED IMPLANT;  Surgeon: Rene Paci, MD;  Location: [facility];  Service: Urology;  Laterality: N/A;   SPACE OAR INSTILLATION N/A 08/15/2020   Procedure: SPACE OAR INSTILLATION;  Surgeon: Rene Paci, MD;  Location: [facility];  Service: Urology;  Laterality: N/A;   Family History  Problem Relation Age of Onset   Prostate cancer Brother    Breast cancer Neg Hx    Colon cancer Neg Hx    Pancreatic cancer Neg Hx    Social History   Socioeconomic History   Marital status: Married    Spouse name: Not on file   Number of children: 4   Years of education: Not on file   Highest education level: Not on file  Occupational History   Not on file  Tobacco Use   Smoking status: Never   Smokeless tobacco: Never  Vaping Use   Vaping status: Never Used  Substance and Sexual Activity   Alcohol use: No   Drug use: No   Sexual activity: Not Currently    Partners: Female  Other Topics Concern   Not on file  Social History Narrative   Not on file   Social Drivers of Health   Financial Resource Strain: Not on file  Food Insecurity: Not on file  Transportation Needs: Not on file  Physical Activity: Not on file  Stress: Not on file  Social Connections: Unknown (02/13/2022)   Received from [facility], [facility]   Social Network    Social Network: Not on file  Intimate Partner Violence: Not At Risk (06/30/2023)   Received from [facility]   HITS    Over the last 12 months how often did your partner physically hurt you?: Never    Over the last 12 months how often did your partner insult you or talk down to you?: Never    Over the last 12 months how often did your partner threaten you with physical harm?: Never     Over the last 12 months how often did your partner scream or curse at you?: Never   Outpatient Medications Prior to Visit  Medication Sig Dispense Refill   allopurinol (ZYLOPRIM) 100 MG tablet Take 1 tablet (100 mg total) by mouth daily. 180 tablet 0   amLODipine (NORVASC) 10 MG tablet Take 1 tablet (10 mg total) by mouth daily. 180 tablet 0   brimonidine-timolol (COMBIGAN) 0.2-0.5 % ophthalmic solution Place 1 drop into both eyes daily.     chlorthalidone (HYGROTON) 25 MG tablet Take 1 tablet (25 mg total) by mouth daily. 180 tablet 0   ibuprofen (  ADVIL) 800 MG tablet Take 800 mg by mouth every 8 (eight) hours as needed for headache or moderate pain.     latanoprost (XALATAN) 0.005 % ophthalmic solution Place 1 drop into both eyes at bedtime.     losartan (COZAAR) 100 MG tablet Take 1 tablet (100 mg total) by mouth daily. 180 tablet 0   Multiple Vitamin (MULTIVITAMIN WITH MINERALS) TABS tablet Take 1 tablet by mouth daily.     sildenafil (VIAGRA) 50 MG tablet Take 1 tablet (50 mg total) by mouth daily as needed for erectile dysfunction. 15 tablet 11   atorvastatin (LIPITOR) 20 MG tablet Take 1 tablet (20 mg total) by mouth daily. 180 tablet 0   gabapentin (NEURONTIN) 100 MG capsule Take 1 capsule (100 mg total) by mouth 3 (three) times daily. 180 capsule 0   glimepiride (AMARYL) 2 MG tablet TAKE 1 TABLET BY MOUTH TWICE DAILY 360 tablet 0   metFORMIN (GLUCOPHAGE) 1000 MG tablet TAKE 1 TABLET BY MOUTH TWICE DAILY 360 tablet 0   tamsulosin (FLOMAX) 0.4 MG CAPS capsule Take 1 capsule (0.4 mg total) by mouth daily. 180 capsule 0   No facility-administered medications prior to visit.   No Known Allergies  ROS: see HPI     Objective  Today's Vitals   12/28/23 1331 12/28/23 1423  BP: (!) 151/79 137/83  Pulse: 72   Temp: 97.7 F (36.5 C)   TempSrc: Oral   SpO2: 96%   Weight: 205 lb 3.2 oz (93.1 kg)   Height: 5' 7.72" (1.72 m)   PainSc: 0-No pain    GENERAL: Well-appearing, in NAD.  Well nourished.  SKIN: Pink, warm and dry. No rash, lesion, ulceration, or ecchymoses.  Head: Normocephalic. RESPIRATORY: Chest wall symmetrical. Respirations even and non-labored. Breath sounds clear to auscultation bilaterally.  CARDIAC: S1, S2 present, regular rate and rhythm without murmur or gallops. Peripheral pulses 2+ bilaterally UEs and 1+ in LEs. Bilateral lower extremity edema 1+.   MSK: Muscle tone and strength appropriate for age. Joints w/o tenderness, redness, or swelling.  EXTREMITIES: Without clubbing, cyanosis, or edema.  NEUROLOGIC: No motor or sensory deficits. Steady, even gait. C2-C12 intact.  PSYCH/MENTAL STATUS: Alert, oriented x 3. Cooperative, appropriate mood and affect.     Physical Exam Cardiovascular:     Pulses:          Dorsalis pedis pulses are 2+ on the right side and 2+ on the left side.       Posterior tibial pulses are 2+ on the right side and 2+ on the left side.  Musculoskeletal:     Right lower leg: 1+ Pitting Edema present.     Left lower leg: 1+ Pitting Edema present.       Feet:  Feet:     Right foot:     Protective Sensation: 10 sites tested.  8 sites sensed.     Skin integrity: Skin integrity normal.     Toenail Condition: Right toenails are normal.     Left foot:     Protective Sensation: 10 sites tested.  8 sites sensed.     Skin integrity: Skin integrity normal.     Toenail Condition: Left toenails are normal.     Comments: Decreased sensation on the heel     Assessment & Plan:   1. Encounter to establish care (Primary) Patient is a 89- year-old male who presents today to establish care with primary care at [facility]. Reviewed the past medical history, family history, social history,  surgical history, medications and allergies today- updates made as indicated. Patient has concerns today about bilateral lower extremity edema and needs medication refills.    2. Type 2 diabetes mellitus with hyperglycemia, without long-term current  use of insulin (HCC) Previous hemoglobin A1c performed 05/20/2023 with results of 7.5%. POCT A1c 5.7%. Patient has had multiple hypoglycemic episodes. Discontinue glimepiride. Continue with metformin 1000mg  BID at this time. Referral placed to ophthalmology due to recent blurred vision and past eye surgery. Foot exam performed with decreased sensation in the heel. Advised patient to continue to monitor blood sugar levels and return to office sooner than scheduled appointment if hypoglycemia continues.  - Ambulatory referral to Ophthalmology - POCT HgB A1C - gabapentin (NEURONTIN) 100 MG capsule; Take 1 capsule (100 mg total) by mouth 3 (three) times daily.  Dispense: 180 capsule; Refill: 5 - metFORMIN (GLUCOPHAGE) 1000 MG tablet; TAKE 1 TABLET BY MOUTH TWICE DAILY  Dispense: 360 tablet; Refill: 1 - POCT HgB A1C  3. Hyperlipidemia associated with type 2 diabetes mellitus (HCC) Recent lipid panel done on 05/20/2023 with elevated total chol 204 and elevated LDL 124. Currently taking atorvastatin 20mg  daily. Rx sent to pharmacy on file.  - atorvastatin (LIPITOR) 20 MG tablet; Take 1 tablet (20 mg total) by mouth daily.  Dispense: 90 tablet; Refill: 3  4. Bilateral lower extremity edema Patient flew back to this country from [place] about a week ago and has noticed edema in his lower extremities. Wife states that he has been elevating them and wearing compression socks with some relief. Physical exam with 1+ edema in both lower legs. Patient is currently taking chlorthalidone 25mg  daily. Will check BNP and BMP.  - B Nat Peptide  5. Essential hypertension Patient presents today with slightly elevated blood pressure, repeat BP well controlled. Patient in no acute distress and is well-appearing. Denies chest pain, shortness of breath, lower extremity edema, vision changes, headaches. Cardiovascular exam with heart regular rate and rhythm. Normal heart sounds, no murmurs present. Bilateral lower extremity edema  present 1+. Lungs clear to auscultation bilaterally. Patient is currently taking amlodipine 10mg  daily, chlorthalidone 25mg  daily, and losartan 100mg  daily. No refills needed. Will check kidney function and potassium level.   - Basic Metabolic Panel (BMET)  6. Lower urinary tract symptoms (LUTS) Provided patient with refill.  - tamsulosin (FLOMAX) 0.4 MG CAPS capsule; Take 1 capsule (0.4 mg total) by mouth daily.  Dispense: 90 capsule; Refill: 3  7. Stage 3b chronic kidney disease (CKD) (HCC) Review of labs- serum creatinine 1.62 and eGFR 43. Call placed to patient to discuss medication adjustments and lab results- will attempt to schedule follow-up office visit to discuss with patient due to language barrier. Plan is to decrease metformin 1000mg  BID to 500mg  BID, stop NSAIDs (Advil), decrease allopurinol from 100mg  daily to 50mg  daily stop losartan 100mg  daily, and increase chlorthalidone from 25mg  to 50mg  for his lower extremity edema and better blood pressure control.   Return in about 3 months (around 03/29/2024) for chronic conditions .   Alyson Reedy, FNP

## 2023-12-28 NOTE — Patient Instructions (Signed)

## 2023-12-29 LAB — BASIC METABOLIC PANEL
BUN/Creatinine Ratio: 10 (ref 10–24)
BUN: 16 mg/dL (ref 8–27)
CO2: 23 mmol/L (ref 20–29)
Calcium: 11.1 mg/dL — ABNORMAL HIGH (ref 8.6–10.2)
Chloride: 103 mmol/L (ref 96–106)
Creatinine, Ser: 1.62 mg/dL — ABNORMAL HIGH (ref 0.76–1.27)
Glucose: 53 mg/dL — ABNORMAL LOW (ref 70–99)
Potassium: 4.9 mmol/L (ref 3.5–5.2)
Sodium: 140 mmol/L (ref 134–144)
eGFR: 43 mL/min/{1.73_m2} — ABNORMAL LOW (ref >59)

## 2023-12-29 LAB — BRAIN NATRIURETIC PEPTIDE: BNP: 20.3 pg/mL (ref 0.0–100.0)

## 2023-12-31 ENCOUNTER — Telehealth: Payer: Self-pay

## 2023-12-31 DIAGNOSIS — N1832 Chronic kidney disease, stage 3b: Secondary | ICD-10-CM | POA: Insufficient documentation

## 2023-12-31 MED ORDER — METFORMIN HCL 500 MG PO TABS: 500.0000 mg | ORAL_TABLET | Freq: Two times a day (BID) | ORAL | 1 refills | Status: DC

## 2023-12-31 MED ORDER — CHLORTHALIDONE 25 MG PO TABS: 50.0000 mg | ORAL_TABLET | Freq: Every day | ORAL | 0 refills | Status: DC

## 2023-12-31 MED ORDER — ALLOPURINOL 100 MG PO TABS: 50.0000 mg | ORAL_TABLET | Freq: Every day | ORAL | 1 refills | Status: DC

## 2023-12-31 NOTE — Addendum Note (Signed)
 Addended by: Alyson Reedy on: 12/31/2023 10:39 AM   Modules accepted: Orders

## 2023-12-31 NOTE — Telephone Encounter (Signed)
 Please make an appointment for the patient to be seen , and go over lab results

## 2023-12-31 NOTE — Telephone Encounter (Signed)
 Called the langauge line for assist to call the patient , LVM for patient to call the office to give blood sugar readings

## 2024-01-04 ENCOUNTER — Ambulatory Visit: Payer: BLUE CROSS/BLUE SHIELD | Admitting: Medical-Surgical

## 2024-01-05 ENCOUNTER — Ambulatory Visit (INDEPENDENT_AMBULATORY_CARE_PROVIDER_SITE_OTHER): Admitting: Family Medicine

## 2024-01-05 ENCOUNTER — Encounter: Payer: Self-pay | Admitting: Family Medicine

## 2024-01-05 VITALS — BP 149/77 | HR 70 | Ht 67.0 in | Wt 203.4 lb

## 2024-01-05 DIAGNOSIS — N1832 Chronic kidney disease, stage 3b: Secondary | ICD-10-CM

## 2024-01-05 DIAGNOSIS — I10 Essential (primary) hypertension: Secondary | ICD-10-CM | POA: Diagnosis not present

## 2024-01-05 DIAGNOSIS — E1165 Type 2 diabetes mellitus with hyperglycemia: Secondary | ICD-10-CM | POA: Diagnosis not present

## 2024-01-05 DIAGNOSIS — Z7984 Long term (current) use of oral hypoglycemic drugs: Secondary | ICD-10-CM

## 2024-01-05 DIAGNOSIS — E162 Hypoglycemia, unspecified: Secondary | ICD-10-CM | POA: Diagnosis not present

## 2024-01-05 MED ORDER — DAPAGLIFLOZIN PROPANEDIOL 10 MG PO TABS: 10.0000 mg | ORAL_TABLET | Freq: Every day | ORAL | 3 refills | Status: DC

## 2024-01-05 MED ORDER — METFORMIN HCL 500 MG PO TABS: 500.0000 mg | ORAL_TABLET | Freq: Every day | ORAL | 1 refills | Status: DC

## 2024-01-05 NOTE — Progress Notes (Addendum)
 Established Patient Office Visit  Subjective  Patient ID: Caleb Robbins, male    DOB: Jul 24, 1946  Age: 78 y.o. MRN: [id number]  Chief Complaint  Patient presents with   Follow-up    Follow up with Blood work    Degan Hanser is a 78 year old male patient who presents today to discuss recent lab work and medication adjustments. He presents today with his daughter who is translating.   Serum creatinine: 1.62 eGFR: 43 K 4.9 Na 140 Ca 11.1  Review of labs from [place], eGFR 43   BLOOD SUGARS:  3/18: 69 3/19: 62 3/20:105 3/21: 93 3/22: 93 3/23: 120 3/24: 110 3/25: 109   BLOOD PRESSURE:  3/18: 159/78 3/19: 160/81 3/20: 147/76 3/21: 146/76 3/22: 158/77 3/23: 145/75 3/24: 160/80 Heart rate mostly in the 70s  ROS: see HPI     Objective:     BP (!) 149/77 (BP Location: Right Arm, Patient Position: Sitting, Cuff Size: Normal)   Pulse 70   Ht 5\' 7"  (1.702 m)   Wt 203 lb 6.4 oz (92.3 kg)   SpO2 95%   BMI 31.86 kg/m  BP Readings from Last 3 Encounters:  01/05/24 (!) 149/77  12/28/23 137/83  07/07/23 133/66     Physical Exam Vitals reviewed.  Constitutional:      Appearance: Normal appearance.  Cardiovascular:     Rate and Rhythm: Normal rate and regular rhythm.     Pulses: Normal pulses.     Heart sounds: Normal heart sounds.  Pulmonary:     Effort: Pulmonary effort is normal.     Breath sounds: Normal breath sounds.  Musculoskeletal:     Right lower leg: No edema.     Left lower leg: No edema.  Neurological:     Mental Status: He is alert.  Psychiatric:        Mood and Affect: Mood normal.        Behavior: Behavior normal.       Assessment & Plan:   1. Stage 3b chronic kidney disease (CKD) (HCC) (Primary) Last OV 3/17: serum creatinine 1.62 and eGFR 43. Discussed what it means for patient to be in stage 3b CKD with patient and his daughter. Advised them of nephrology referral and agreed with plan of care. Ordered BMP to repeat kidney function  and obtain urine microalbumin with creatinine ratio. Counseled patient to stop taking Advil and decrease allopurinol from 100mg  daily to 50mg  daily. Referral to nephrology placed.  - Basic Metabolic Panel (BMET) - Urine Microalbumin w/creat. ratio - dapagliflozin propanediol (FARXIGA) 10 MG TABS tablet; Take 1 tablet (10 mg total) by mouth daily.  Dispense: 30 tablet; Refill: 3 - Ambulatory referral to Nephrology  2. Type 2 diabetes mellitus with hyperglycemia, without long-term current use of insulin (HCC) Counseled patient about decreasing metformin from 1000mg  BID to 500mg  daily, due to recent kidney function. Will send in [place] for CKD and DM2 diagnoses. Advised once he is able to pick up Marcelline Deist, will stop taking metformin. Continue to monitor hemoglobin A1c Q3-6 months, next due 03/29/2024.  - dapagliflozin propanediol (FARXIGA) 10 MG TABS tablet; Take 1 tablet (10 mg total) by mouth daily.  Dispense: 30 tablet; Refill: 3 - metFORMIN (GLUCOPHAGE) 500 MG tablet; Take 1 tablet (500 mg total) by mouth daily with breakfast. TAKE 1 TABLET BY MOUTH TWICE DAILY  Dispense: 180 tablet; Refill: 1 - Ambulatory referral to Nephrology  3. Essential hypertension Patient presents today with slightly elevated blood pressure, repeat blood pressure still  slightly elevated for CKD. Patient in no acute distress and is well-appearing. Denies chest pain, shortness of breath, lower extremity edema, vision changes, headaches. Cardiovascular exam with heart regular rate and rhythm. Normal heart sounds, no murmurs present. No lower extremity edema present. Lungs clear to auscultation bilaterally. Patient is currently taking chlorthalidone 25mg  daily. Plan to increase to 50mg  daily and stop losartan 100mg  daily due to recent kidney function. Follow-up in 3 months.  - Ambulatory referral to Nephrology  4. Hypoglycemia Will repeat BMP today to assess blood sugar due to history of hypoglycemia.  - Basic Metabolic Panel  (BMET)  5. Hypercalcemia Will repeat BMP to assess calcium level.  - Basic Metabolic Panel (BMET)   Return in about 3 months (around 04/06/2024) for HTN follow-up, Diabetes f/u.    Spent 40 minutes on this patient encounter, including preparation, chart review, face-to-face counseling with patient and coordination of care, and documentation of encounter.     Alyson Reedy, FNP

## 2024-01-05 NOTE — Patient Instructions (Addendum)
 Due to your kidney function, I am going to send you to a kidney doctor to help prevent any further decline in your kidney function.   Metformin: 1000mg  BID to 500mg  daily  Will be changing to [place] to help protect the kidneys and manage your diabetes.    Allopurinol: 100mg  daily to 50mg  daily   Chlorthalidone: increase from 25 mg to 50mg  daily for blood pressure control and lower leg swelling.   Stop Advil Stop Losartan 100mg 

## 2024-01-06 LAB — BASIC METABOLIC PANEL
BUN/Creatinine Ratio: 14 (ref 10–24)
BUN: 22 mg/dL (ref 8–27)
CO2: 23 mmol/L (ref 20–29)
Calcium: 10.2 mg/dL (ref 8.6–10.2)
Chloride: 100 mmol/L (ref 96–106)
Creatinine, Ser: 1.57 mg/dL — ABNORMAL HIGH (ref 0.76–1.27)
Glucose: 136 mg/dL — ABNORMAL HIGH (ref 70–99)
Potassium: 5 mmol/L (ref 3.5–5.2)
Sodium: 138 mmol/L (ref 134–144)
eGFR: 45 mL/min/{1.73_m2} — ABNORMAL LOW (ref >59)

## 2024-01-06 LAB — MICROALBUMIN / CREATININE URINE RATIO
Creatinine, Urine: 64.6 mg/dL
Microalb/Creat Ratio: 21 mg/g{creat} (ref 0–29)
Microalbumin, Urine: 13.3 ug/mL

## 2024-01-13 ENCOUNTER — Other Ambulatory Visit: Payer: Self-pay | Admitting: Nephrology

## 2024-01-13 DIAGNOSIS — I10 Essential (primary) hypertension: Secondary | ICD-10-CM

## 2024-01-13 DIAGNOSIS — E1122 Type 2 diabetes mellitus with diabetic chronic kidney disease: Secondary | ICD-10-CM

## 2024-01-13 DIAGNOSIS — N1831 Chronic kidney disease, stage 3a: Secondary | ICD-10-CM

## 2024-01-15 ENCOUNTER — Telehealth: Payer: Self-pay

## 2024-01-15 MED ORDER — METFORMIN HCL 500 MG PO TABS: 500.0000 mg | ORAL_TABLET | Freq: Two times a day (BID) | ORAL | 1 refills | Status: DC

## 2024-01-15 NOTE — Telephone Encounter (Signed)
 Corrected script has been sent to [facility]. Metformin had 2 different directions on it. Patient is now taking BID daily instead of once.

## 2024-01-15 NOTE — Addendum Note (Signed)
 Addended by: Tonny Bollman on: 01/15/2024 01:10 PM   Modules accepted: Orders

## 2024-01-15 NOTE — Telephone Encounter (Signed)
 Copied from CRM [id number]. Topic: Clinical - Prescription Issue >> Jan 15, 2024 12:29 PM Franchot Heidelberg wrote: Reason for CRM: [facility] called from the pharmacy needing clarification on the Rx directions for Metformin.   [facility] [address] [place] - [address] [place], [place] [address] Phone: [phone]  Fax: [phone]

## 2024-01-15 NOTE — Addendum Note (Signed)
 Addended by: Tonny Bollman on: 01/15/2024 01:07 PM   Modules accepted: Orders

## 2024-01-22 ENCOUNTER — Ambulatory Visit
Admission: RE | Admit: 2024-01-22 | Discharge: 2024-01-22 | Disposition: A | Discharge status: Home / Self Care | Source: Ambulatory Visit | Attending: Nephrology | Admitting: Nephrology

## 2024-01-22 DIAGNOSIS — I10 Essential (primary) hypertension: Secondary | ICD-10-CM | POA: Diagnosis present

## 2024-01-22 DIAGNOSIS — E1122 Type 2 diabetes mellitus with diabetic chronic kidney disease: Secondary | ICD-10-CM | POA: Diagnosis present

## 2024-01-22 DIAGNOSIS — N1831 Chronic kidney disease, stage 3a: Secondary | ICD-10-CM | POA: Diagnosis present

## 2024-02-02 ENCOUNTER — Ambulatory Visit: Payer: Self-pay

## 2024-02-02 NOTE — Telephone Encounter (Signed)
  Chief Complaint: high blood sugar readings Symptoms: high blood sugar readings Frequency: the last week Pertinent Negatives: Patient denies dizziness, chest pain, blurry vision, frequent urination Disposition: [] ED /[] Urgent Care (no appt availability in office) / [] Appointment(In office/virtual)/ []  [facility]/ [x] Home Care/ [] Refused Recommended Disposition /[] [facility]/ []  Follow-up with PCP Additional Notes: Spoke with patient's daughter who states that patient is a Type 2 diabetic currently taking Farxiga  for management. Patient's daughter reports that she is unsure of his usual range but it is typically "much lower" than what he is currently running. Daughter reports this last week patient has had sugars in the 190's. Patient is denying all other symptoms such as dizziness, chest pain, blurry vision, and frequent urination. Advised daughter of home care recommendation at this time, per protocol. Discussed calling back if symptoms arise or if sugar continues to rise. Daughter verbalized understanding.     Copied From CRM [id number]. Reason for Triage:   Aram is telling daughter that his blood sugar is going up. The highest was 202. They want to know what to do about this. Should they schedule an appointment?  Daughter's number is [phone]; please call    Reason for Disposition  Blood glucose 70-240 mg/dL (3.9 -46.9 mmol/L)  Answer Assessment - Initial Assessment Questions 1. BLOOD GLUCOSE: "What is your blood glucose level?"      190's this week 2. ONSET: "When did you check the blood glucose?"     1 week ago 3. USUAL RANGE: "What is your glucose level usually?" (e.g., usual fasting morning value, usual evening value)     "He's usually really low" 4. KETONES: "Do you check for ketones (urine or blood test strips)?" If Yes, ask: "What does the test show now?"      denies 5. TYPE 1 or 2:  "Do you know what type of diabetes you have?"  (e.g., Type 1,  Type 2, Gestational; doesn't know)      Type 2 6. INSULIN: "Do you take insulin?" "What type of insulin(s) do you use? What is the mode of delivery? (syringe, pen; injection or pump)?"      none 7. DIABETES PILLS: "Do you take any pills for your diabetes?" If Yes, ask: "Have you missed taking any pills recently?"     farxiga  8. OTHER SYMPTOMS: "Do you have any symptoms?" (e.g., fever, frequent urination, difficulty breathing, dizziness, weakness, vomiting)     denies  Protocols used: Diabetes - High Blood Sugar-A-AH

## 2024-02-03 ENCOUNTER — Emergency Department (HOSPITAL_COMMUNITY)

## 2024-02-03 ENCOUNTER — Emergency Department (HOSPITAL_COMMUNITY)
Admission: EM | Admit: 2024-02-03 | Discharge: 2024-02-03 | Disposition: A | Discharge status: Home / Self Care | Attending: Emergency Medicine | Admitting: Emergency Medicine

## 2024-02-03 DIAGNOSIS — Z79899 Other long term (current) drug therapy: Secondary | ICD-10-CM | POA: Diagnosis not present

## 2024-02-03 DIAGNOSIS — E1165 Type 2 diabetes mellitus with hyperglycemia: Secondary | ICD-10-CM | POA: Diagnosis not present

## 2024-02-03 DIAGNOSIS — R61 Generalized hyperhidrosis: Secondary | ICD-10-CM | POA: Diagnosis not present

## 2024-02-03 DIAGNOSIS — R944 Abnormal results of kidney function studies: Secondary | ICD-10-CM | POA: Insufficient documentation

## 2024-02-03 DIAGNOSIS — R739 Hyperglycemia, unspecified: Secondary | ICD-10-CM

## 2024-02-03 DIAGNOSIS — R42 Dizziness and giddiness: Secondary | ICD-10-CM | POA: Diagnosis present

## 2024-02-03 DIAGNOSIS — Z7984 Long term (current) use of oral hypoglycemic drugs: Secondary | ICD-10-CM | POA: Diagnosis not present

## 2024-02-03 DIAGNOSIS — R55 Syncope and collapse: Secondary | ICD-10-CM

## 2024-02-03 LAB — COMPREHENSIVE METABOLIC PANEL WITH GFR
ALT: 13 U/L (ref 0–44)
AST: 18 U/L (ref 15–41)
Albumin: 3.7 g/dL (ref 3.5–5.0)
Alkaline Phosphatase: 52 U/L (ref 38–126)
Anion gap: 12 (ref 5–15)
BUN: 31 mg/dL — ABNORMAL HIGH (ref 8–23)
CO2: 23 mmol/L (ref 22–32)
Calcium: 9.3 mg/dL (ref 8.9–10.3)
Chloride: 93 mmol/L — ABNORMAL LOW (ref 98–111)
Creatinine, Ser: 2.31 mg/dL — ABNORMAL HIGH (ref 0.61–1.24)
GFR, Estimated: 28 mL/min — ABNORMAL LOW (ref >60)
Glucose, Bld: 362 mg/dL — ABNORMAL HIGH (ref 70–99)
Potassium: 4.1 mmol/L (ref 3.5–5.1)
Sodium: 128 mmol/L — ABNORMAL LOW (ref 135–145)
Total Bilirubin: 0.6 mg/dL (ref 0.0–1.2)
Total Protein: 7 g/dL (ref 6.5–8.1)

## 2024-02-03 LAB — CBC WITH DIFFERENTIAL/PLATELET
Abs Immature Granulocytes: 0.01 10*3/uL (ref 0.00–0.07)
Basophils Absolute: 0 10*3/uL (ref 0.0–0.1)
Basophils Relative: 0 %
Eosinophils Absolute: 0 10*3/uL (ref 0.0–0.5)
Eosinophils Relative: 0 %
HCT: 31.3 % — ABNORMAL LOW (ref 39.0–52.0)
Hemoglobin: 10.9 g/dL — ABNORMAL LOW (ref 13.0–17.0)
Immature Granulocytes: 0 %
Lymphocytes Relative: 14 %
Lymphs Abs: 0.7 10*3/uL (ref 0.7–4.0)
MCH: 30.4 pg (ref 26.0–34.0)
MCHC: 34.8 g/dL (ref 30.0–36.0)
MCV: 87.2 fL (ref 80.0–100.0)
Monocytes Absolute: 0.4 10*3/uL (ref 0.1–1.0)
Monocytes Relative: 8 %
Neutro Abs: 3.6 10*3/uL (ref 1.7–7.7)
Neutrophils Relative %: 78 %
Platelets: 168 10*3/uL (ref 150–400)
RBC: 3.59 MIL/uL — ABNORMAL LOW (ref 4.22–5.81)
RDW: 11.9 % (ref 11.5–15.5)
WBC: 4.7 10*3/uL (ref 4.0–10.5)
nRBC: 0 % (ref 0.0–0.2)

## 2024-02-03 LAB — CBG MONITORING, ED
Glucose-Capillary: 373 mg/dL — ABNORMAL HIGH (ref 70–99)
Glucose-Capillary: 234 mg/dL — ABNORMAL HIGH (ref 70–99)

## 2024-02-03 LAB — TROPONIN I (HIGH SENSITIVITY)
Troponin I (High Sensitivity): 5 ng/L (ref <18)
Troponin I (High Sensitivity): 6 ng/L

## 2024-02-03 MED ORDER — SODIUM CHLORIDE 0.9 % IV SOLN: INTRAVENOUS | Status: DC

## 2024-02-03 MED ORDER — SODIUM CHLORIDE 0.9 % IV BOLUS
500.0000 mL | Freq: Once | INTRAVENOUS | Status: AC
  Administered 2024-02-03: 500 mL via INTRAVENOUS

## 2024-02-03 NOTE — ED Provider Notes (Addendum)
 [facility]GENC[facility] DEPARTMENT AT [facility] Provider Note   CSN: [id number] Arrival date & time: 02/03/24  1427     History  Chief Complaint  Patient presents with   Dizziness    Caleb Robbins is a 78 y.o. male.  Patient is from [place] and has a specific dialect from there.  Speaks a little bit of English but his daughter speaks good [place].  Our translator not able to speak his language.  They were at [facility] he went to the food court to sit down and they were notified that he was sweaty and not feeling well and asking for his family.  Family thought maybe his blood sugars were low he is a diabetic.  Did have food this morning they gave him orange juice gave him candy and he started to feel better.  EMS was called apparently when EMS got there blood sugar was high and his blood pressure was a little bit on the low side at 80.  Here blood pressures have been completely fine.  Patient denies any chest pain any nausea or vomiting.  He did have a brief headache when he was diaphoretic but he says everything has resolved now.       Home Medications Prior to Admission medications   Medication Sig Start Date End Date Taking? Authorizing Provider  allopurinol  (ZYLOPRIM ) 100 MG tablet Take 0.5 tablets (50 mg total) by mouth daily. 12/31/23   Wilhelmena Hanson, FNP  amLODipine  (NORVASC ) 10 MG tablet Take 1 tablet (10 mg total) by mouth daily. 06/24/23   Cherre Cornish, NP  atorvastatin  (LIPITOR) 20 MG tablet Take 1 tablet (20 mg total) by mouth daily. 12/28/23   Wilhelmena Hanson, FNP  brimonidine-timolol (COMBIGAN) 0.2-0.5 % ophthalmic solution Place 1 drop into both eyes daily.    [provider]  chlorthalidone  (HYGROTON ) 25 MG tablet Take 2 tablets (50 mg total) by mouth daily. 12/31/23   Wilhelmena Hanson, FNP  dapagliflozin  propanediol (FARXIGA ) 10 MG TABS tablet Take 1 tablet (10 mg total) by mouth daily. 01/05/24   Wilhelmena Hanson, FNP  gabapentin  (NEURONTIN ) 100 MG capsule  Take 1 capsule (100 mg total) by mouth 3 (three) times daily. 12/28/23   Butler, Kristina, FNP  latanoprost (XALATAN) 0.005 % ophthalmic solution Place 1 drop into both eyes at bedtime.    [provider]  metFORMIN  (GLUCOPHAGE ) 500 MG tablet Take 1 tablet (500 mg total) by mouth 2 (two) times daily with a meal. 01/15/24   Wilhelmena Hanson, FNP  Multiple Vitamin (MULTIVITAMIN WITH MINERALS) TABS tablet Take 1 tablet by mouth daily.    [provider]  tamsulosin  (FLOMAX ) 0.4 MG CAPS capsule Take 1 capsule (0.4 mg total) by mouth daily. 12/28/23   Wilhelmena Hanson, FNP      Allergies    Patient has no allergy information on record.    Review of Systems   Review of Systems  Constitutional:  Positive for diaphoresis. Negative for chills and fever.  HENT:  Negative for ear pain and sore throat.   Eyes:  Negative for pain and visual disturbance.  Respiratory:  Negative for cough and shortness of breath.   Cardiovascular:  Negative for chest pain and palpitations.  Gastrointestinal:  Negative for abdominal pain and vomiting.  Genitourinary:  Negative for dysuria and hematuria.  Musculoskeletal:  Negative for arthralgias and back pain.  Skin:  Negative for color change and rash.  Neurological:  Positive for headaches. Negative for seizures and syncope.  All other systems  reviewed and are negative.   Physical Exam Updated Vital Signs BP 136/68   Pulse 83   Temp 98.3 F (36.8 C)   Resp 18   SpO2 100%  Physical Exam Vitals and nursing note reviewed.  Constitutional:      General: He is not in acute distress.    Appearance: Normal appearance. He is well-developed. He is not ill-appearing.  HENT:     Head: Normocephalic and atraumatic.     Mouth/Throat:     Mouth: Mucous membranes are moist.  Eyes:     Conjunctiva/sclera: Conjunctivae normal.  Cardiovascular:     Rate and Rhythm: Normal rate and regular rhythm.     Heart sounds: No murmur heard. Pulmonary:      Effort: Pulmonary effort is normal. No respiratory distress.     Breath sounds: Normal breath sounds.  Abdominal:     Palpations: Abdomen is soft.     Tenderness: There is no abdominal tenderness.  Musculoskeletal:        General: No swelling.     Cervical back: Normal range of motion and neck supple.  Skin:    General: Skin is warm and dry.     Capillary Refill: Capillary refill takes less than 2 seconds.  Neurological:     General: No focal deficit present.     Mental Status: He is alert.     Cranial Nerves: No cranial nerve deficit.     Sensory: No sensory deficit.     Motor: No weakness.  Psychiatric:        Mood and Affect: Mood normal.     ED Results / Procedures / Treatments   Labs (all labs ordered are listed, but only abnormal results are displayed) Labs Reviewed  CBC WITH DIFFERENTIAL/PLATELET - Abnormal; Notable for the following components:      Result Value   RBC 3.59 (*)    Hemoglobin 10.9 (*)    HCT 31.3 (*)    All other components within normal limits  COMPREHENSIVE METABOLIC PANEL WITH GFR - Abnormal; Notable for the following components:   Sodium 128 (*)    Chloride 93 (*)    Glucose, Bld 362 (*)    BUN 31 (*)    Creatinine, Ser 2.31 (*)    GFR, Estimated 28 (*)    All other components within normal limits  CBG MONITORING, ED - Abnormal; Notable for the following components:   Glucose-Capillary 373 (*)    All other components within normal limits  TROPONIN I (HIGH SENSITIVITY)  TROPONIN I (HIGH SENSITIVITY)    EKG EKG Interpretation Date/Time:  Wednesday February 03 2024 14:39:54 EDT Ventricular Rate:  88 PR Interval:  46 QRS Duration:  109 QT Interval:  352 QTC Calculation: 426 R Axis:   -87  Text Interpretation: Sinus or ectopic atrial rhythm Short PR interval Left anterior fascicular block Consider right ventricular hypertrophy Consider anterior infarct ST elevation, consider inferior injury New since previous tracing Confirmed by Kimiyah Blick,  Lynniah Janoski [id number]) on 02/03/2024 3:43:58 PM  Radiology DG Chest Port 1 View Result Date: 02/03/2024 CLINICAL DATA:  Chest pain EXAM: PORTABLE CHEST - 1 VIEW COMPARISON:  None Available. FINDINGS: Lungs are clear.  No pneumothorax. Heart size and mediastinal contours are within normal limits. No effusion. Visualized bones unremarkable. IMPRESSION: No acute cardiopulmonary disease. Electronically Signed   By: Nicoletta Barrier M.D.   On: 02/03/2024 15:35    Procedures Procedures    Medications Ordered in ED Medications - No data to  display  ED Course/ Medical Decision Making/ A&P                                 Medical Decision Making Amount and/or Complexity of Data Reviewed Labs: ordered. Radiology: ordered.   Patient now asymptomatic.  CBC white blood cell count 4.7 hemoglobin 10.9 hematocrit 31.3 platelets 168.  Complete metabolic panel is pending.  Will going to do a fingerstick to see where the sugar is.  Troponins were ordered because we were told by EMS this was for chest pain.  But it does not appear to be the case.  Portable chest x-ray also appears very normal.  EKG showed sinus or ectopic atrial rhythm short PR left anterior fascicular block.  ST elevation consider inferior MI we do have the troponins pending.  Patient's labs blood sugar is elevated at 373 on fingerstick.  On complete metabolic panel is 362 CO2 is normal though at 23 sodium down to 128 but that is to be expected with the sugar being high.  Creatinine is a little bit elevated at 2.31.  Giving a GFR 28 not quite doubling worsening of the creatinine.  Will give some IV fluids.  Chest x-ray is negative.  Initial troponin was 5 delta troponins pending CBC white count 4.7 hemoglobin 10.9 and platelets 168.  Will give some IV fluids will recheck sugar and delta troponin.  Final Clinical Impression(s) / ED Diagnoses Final diagnoses:  Diaphoresis  Hyperglycemia    Rx / DC Orders ED Discharge Orders     None          Nicklas Barns, MD 02/03/24 1542    Nicklas Barns, MD 02/03/24 [id number]

## 2024-02-03 NOTE — ED Triage Notes (Signed)
 Patient BIB EMS for Chest pain and dizziness.

## 2024-02-03 NOTE — ED Provider Notes (Signed)
 Patient handed off to me awaiting IV fluids, recheck blood sugar recheck troponin.  Near syncopal type event.  Unremarkable lab work.  Sodium slightly low at 128 creatinine 2.3.  But troponin normal.  Blood sugar is 362 and suspect likely mild hyponatremia from hyperglycemia.  Overall plan is for hydration recheck blood sugar recheck troponin anticipate discharge home.  Repeat troponins unremarkable.  He show much better.  Blood sugars improved.  Discharged in good condition.  This chart was dictated using voice recognition software.  Despite best efforts to proofread,  errors can occur which can change the documentation meaning.    Lowery Rue, DO 02/03/24 2106

## 2024-02-10 ENCOUNTER — Ambulatory Visit (INDEPENDENT_AMBULATORY_CARE_PROVIDER_SITE_OTHER): Admitting: Family Medicine

## 2024-02-10 ENCOUNTER — Encounter: Payer: Self-pay | Admitting: Family Medicine

## 2024-02-10 VITALS — BP 126/72 | HR 73 | Temp 98.4°F | Resp 18 | Ht 67.0 in | Wt 195.0 lb

## 2024-02-10 DIAGNOSIS — N1832 Chronic kidney disease, stage 3b: Secondary | ICD-10-CM

## 2024-02-10 DIAGNOSIS — I10 Essential (primary) hypertension: Secondary | ICD-10-CM | POA: Diagnosis not present

## 2024-02-10 DIAGNOSIS — Z7984 Long term (current) use of oral hypoglycemic drugs: Secondary | ICD-10-CM

## 2024-02-10 DIAGNOSIS — E1165 Type 2 diabetes mellitus with hyperglycemia: Secondary | ICD-10-CM

## 2024-02-10 DIAGNOSIS — R399 Unspecified symptoms and signs involving the genitourinary system: Secondary | ICD-10-CM | POA: Diagnosis not present

## 2024-02-10 MED ORDER — TAMSULOSIN HCL 0.4 MG PO CAPS: 0.4000 mg | ORAL_CAPSULE | Freq: Every day | ORAL | 3 refills | Status: DC

## 2024-02-10 MED ORDER — AMLODIPINE BESYLATE 10 MG PO TABS: 10.0000 mg | ORAL_TABLET | Freq: Every day | ORAL | 3 refills | Status: DC

## 2024-02-10 NOTE — Patient Instructions (Addendum)
 Medications for blood pressure: please take amlodipine  10mg , chlorthalidone  25mg  & losartan  50mg  daily.   Keep checking blood sugars fasting.   Please call for eye appointment at [facility]  [address] Geraldene Kleine [place], [place] [address] [phone]

## 2024-02-10 NOTE — Progress Notes (Signed)
 Established Patient Office Visit  Subjective  Patient ID: Caleb Robbins, male    DOB: 09-20-1946  Age: 78 y.o. MRN: [id number]  Chief Complaint  Patient presents with   Medical Management of Chronic Issues   DIABETES & HYPERTENSION: Caleb Robbins presents for the medical management of diabetes and hypertension. He presents to the visit with his daughter, who speaks [place].  His fasting blood sugar readings at home have been: 112, 91, 74, 123 101, 122, 121, 218, 134, 120, 139, 153, 116, 144, 140, 206, 216, 219, 211, 305 Currently taking Farxiga  10mg  daily and metformin  500mg  BID with meals. Denies chest pain, shortness of breath, vision changes, polydipsia, polyphagia, polyuria, open wounds/ulcers on feet, and hypoglycemia episodes.  Recent A1c 5.7%, well controlled, on 3/17 He almost passed out at [facility] on 4/23 due to diaphoresis and near syncope. Blood pressure was low and blood sugar was high (?). He was not admitted and there was no clear etiology- possible hyperglycemia.   Lab Results  Component Value Date   HGBA1C 0.0 (A) 12/28/2023   HGBA1C 0.0 12/28/2023   HGBA1C 5.7 (A) 12/28/2023   HGBA1C 0.0 12/28/2023    04/24/2021 Lab Results  Component Value Date   LABMICR 13.3 01/05/2024   MICROALBUR 10 03/06/2022    Wt Readings from Last 3 Encounters:  02/10/24 195 lb (88.5 kg)  01/05/24 203 lb 6.4 oz (92.3 kg)  12/28/23 205 lb 3.2 oz (93.1 kg)   HYPERTENSION: Caleb Robbins presents for the medical management of hypertension.  Patient's current hypertension medication regimen is: amlodipine  10mg , chlorthalidone  25mg  & losartan  50mg  daily  Patient is currently taking prescribed medications for HTN.  Patient is regularly keeping a check on BP at home. 171/82, 164/77, 162/78, 160/80, 140/75, 159/78, 153/79, 142/75, 153/77, 144/77, 139/72, 131/68, 138/72, 136/77, 140/78, 156/78, 163/78, 173/78, 146/74.  Denies headache, dizziness, CP, SHOB, vision changes.   BP Readings from  Last 3 Encounters:  02/10/24 126/72  02/03/24 (!) 157/73  01/05/24 (!) 149/77    ROS: see HPI     Objective:     BP 126/72 (BP Location: Left Arm, Patient Position: Sitting, Cuff Size: Normal)   Pulse 73   Temp 98.4 F (36.9 C) (Oral)   Resp 18   Ht 5\' 7"  (1.702 m)   Wt 195 lb (88.5 kg)   SpO2 97%   BMI 30.54 kg/m  BP Readings from Last 3 Encounters:  02/10/24 126/72  02/03/24 (!) 157/73  01/05/24 (!) 149/77     Physical Exam Constitutional:      Appearance: Normal appearance.  Cardiovascular:     Rate and Rhythm: Normal rate and regular rhythm.     Pulses: Normal pulses.          Radial pulses are 2+ on the right side and 2+ on the left side.       Dorsalis pedis pulses are 2+ on the right side and 2+ on the left side.       Posterior tibial pulses are 2+ on the right side and 2+ on the left side.     Heart sounds: Normal heart sounds.  Pulmonary:     Effort: Pulmonary effort is normal.     Breath sounds: Normal breath sounds.  Musculoskeletal:     Right lower leg: No edema.     Left lower leg: No edema.  Neurological:     Mental Status: He is alert.  Psychiatric:        Mood and Affect:  Mood normal.        Behavior: Behavior normal.     Assessment & Plan:   1. Type 2 diabetes mellitus with hyperglycemia, without long-term current use of insulin (HCC) (Primary) A1c at goal. Discussed continuing to take Farxiga  10mg  daily and metformin  500mg  BID with meals. Advised patient to continue to check fasting blood sugars and if he starts to feel like his blood sugars are out of normal range. Discussed GLP-1 medication; however, patient will be going to [place] in July and will not be able to receive medication there.  Nonpharmaological interventions such as focusing on eating a low carb diet, high in vegetables and fruits, and daily physical activity discussed. Discussed signs and symptoms of hypoglycemia and need to present to the ED. Patient verbalizes understanding  regarding plan of care and all questions answered.   2. Essential hypertension Patient presents today with slightly elevated blood pressure, repeat blood pressure is well controlled. Patient in no acute distress and is well-appearing. Denies chest pain, shortness of breath, lower extremity edema, vision changes, headaches. Cardiovascular exam with heart regular rate and rhythm. Normal heart sounds, no murmurs present. No lower extremity edema present. Lungs clear to auscultation bilaterally. Patient is currently taking amlodipine  10mg , chlorthalidone  25mg  & losartan  50mg  daily. Review of chart- nephrology placed patient back on losartan  for kidney protection. Reviewed medications with patient and his daughter. Refills provided today. Advised patient to continue to monitor blood pressure at home. - amLODipine  (NORVASC ) 10 MG tablet; Take 1 tablet (10 mg total) by mouth daily.  Dispense: 90 tablet; Refill: 3  3. Stage 3b chronic kidney disease (CKD) (HCC) Discussed importance of well controlled blood pressure and blood sugars to prevent further damage to kidneys.   4. Lower urinary tract symptoms (LUTS) Rx sent to pharmacy on file.  - tamsulosin  (FLOMAX ) 0.4 MG CAPS capsule; Take 1 capsule (0.4 mg total) by mouth daily.  Dispense: 90 capsule; Refill: 3     Return in about 4 weeks (around 03/09/2024) for chronic conditions .    Wilhelmena Hanson, FNP

## 2024-02-29 ENCOUNTER — Other Ambulatory Visit: Payer: Self-pay

## 2024-02-29 DIAGNOSIS — C61 Malignant neoplasm of prostate: Secondary | ICD-10-CM

## 2024-03-03 NOTE — Progress Notes (Signed)
 Patient is now scheduled for his PSMA PET on 6/4.  Referral coordinator notified to schedule new patient consult with Dr. Alita Irwin anytime after 6/4.

## 2024-03-10 ENCOUNTER — Encounter: Payer: Self-pay | Admitting: Family Medicine

## 2024-03-10 ENCOUNTER — Ambulatory Visit: Admitting: Family Medicine

## 2024-03-10 VITALS — BP 128/73 | HR 78 | Temp 98.8°F | Resp 18 | Ht 67.0 in | Wt 192.0 lb

## 2024-03-10 DIAGNOSIS — E785 Hyperlipidemia, unspecified: Secondary | ICD-10-CM

## 2024-03-10 DIAGNOSIS — E1169 Type 2 diabetes mellitus with other specified complication: Secondary | ICD-10-CM

## 2024-03-10 DIAGNOSIS — E1165 Type 2 diabetes mellitus with hyperglycemia: Secondary | ICD-10-CM | POA: Diagnosis not present

## 2024-03-10 DIAGNOSIS — I10 Essential (primary) hypertension: Secondary | ICD-10-CM

## 2024-03-10 DIAGNOSIS — R399 Unspecified symptoms and signs involving the genitourinary system: Secondary | ICD-10-CM

## 2024-03-10 DIAGNOSIS — Z7984 Long term (current) use of oral hypoglycemic drugs: Secondary | ICD-10-CM

## 2024-03-10 DIAGNOSIS — N1832 Chronic kidney disease, stage 3b: Secondary | ICD-10-CM | POA: Diagnosis not present

## 2024-03-10 LAB — POCT GLYCOSYLATED HEMOGLOBIN (HGB A1C): Hemoglobin A1C: 7.6 % — AB (ref 4.0–5.6)

## 2024-03-10 MED ORDER — TAMSULOSIN HCL 0.4 MG PO CAPS: 0.4000 mg | ORAL_CAPSULE | Freq: Every day | ORAL | 3 refills | Status: DC

## 2024-03-10 MED ORDER — GABAPENTIN 100 MG PO CAPS: 100.0000 mg | ORAL_CAPSULE | Freq: Three times a day (TID) | ORAL | 5 refills | Status: DC

## 2024-03-10 MED ORDER — AMLODIPINE BESYLATE 10 MG PO TABS: 10.0000 mg | ORAL_TABLET | Freq: Every day | ORAL | 3 refills | Status: DC

## 2024-03-10 MED ORDER — METFORMIN HCL 1000 MG PO TABS: 1000.0000 mg | ORAL_TABLET | Freq: Two times a day (BID) | ORAL | 2 refills | Status: DC

## 2024-03-10 MED ORDER — DAPAGLIFLOZIN PROPANEDIOL 10 MG PO TABS: 10.0000 mg | ORAL_TABLET | Freq: Every day | ORAL | 3 refills | Status: DC

## 2024-03-10 MED ORDER — ALLOPURINOL 100 MG PO TABS: 50.0000 mg | ORAL_TABLET | Freq: Every day | ORAL | 1 refills | Status: DC

## 2024-03-10 MED ORDER — ATORVASTATIN CALCIUM 20 MG PO TABS: 20.0000 mg | ORAL_TABLET | Freq: Every day | ORAL | 3 refills | Status: DC

## 2024-03-10 NOTE — Progress Notes (Unsigned)
 Established Patient Office Visit  Subjective  Patient ID: Caleb Robbins, male    DOB: 01-28-1946  Age: 78 y.o. MRN: [id number]  Chief Complaint  Patient presents with   Diabetes   Chronic Kidney Disease        Patient is a pleasant 78 year old male patient who presents today for type 2 DM, CKD stage IIIb< HTN and secondary hyperparathyroidism follow-up.   CKD Stage 3b-  recent serologic workup with negative SPEP & UPEP, positive ANA with negative secondary panel  Last OV 5/20 with nephrologist Dr. Rhesa Celeste- per note, continue Farxiga  & losartan  to delay kidney progression  HTN- taking amlodipine  & losartan  & chlorthalidone  No mention of chlorthalidone  in nephrologist's note  Home readings: 127/73, 135/78, 139/75, 147/75, 166/81, 164/84, 154/79, 142/72, 134/67, 129/75, 128/72, 133/72, 146/71, 156/79, 165/86, 156/82, 138/76  DM2- metformin  500mg  BID & glimepiride  1mg  daily (0.5 tab) & farxiga  10mg  qd  Fasting blood sugars: 131, 161, 136, 105, 110, 140, 134, 130, 212, 168, 118, 139, 148, 121, 118, 103, 201, 98, 108, 140, 107  Secondary hyperparathyroidism- calcitrol 0.25mcg PO QD   ROS: see HPI     Objective:      BP 128/73 (BP Location: Left Arm, Patient Position: Sitting, Cuff Size: Normal)   Pulse 78   Temp 98.8 F (37.1 C) (Oral)   Resp 18   Ht 5\' 7"  (1.702 m)   Wt 192 lb (87.1 kg)   SpO2 97%   BMI 30.07 kg/m  BP Readings from Last 3 Encounters:  03/10/24 128/73  02/10/24 126/72  02/03/24 (!) 157/73     Physical Exam    Assessment & Plan:   1. Type 2 diabetes mellitus with hyperglycemia, without long-term current use of insulin (HCC) (Primary) Previous A1c 5.7%. POCT A1c in office today 7.6%. Discussed goal of less than 8% for patient his age. Would be beneficial to prevent hyperglycemic episodes to delay progression of kidney function. Discussed continuing Farxiga  10mg  and metformin  500mg  daily. May be reasonable to re-add 1mg  glimepiride  to help monitor blood  sugars. Rx sent to pharmacy on file. Advised patient to continue monitoring fasting blood sugars at home.  - dapagliflozin  propanediol (FARXIGA ) 10 MG TABS tablet; Take 1 tablet (10 mg total) by mouth daily.  Dispense: 30 tablet; Refill: 3 - POCT glycosylated hemoglobin (Hb A1C) - Lipid Profile; Future - metFORMIN  (GLUCOPHAGE ) 500 MG tablet; Take 1 tablet (500 mg total) by mouth 2 (two) times daily with a meal.  Dispense: 60 tablet; Refill: 0 - gabapentin  (NEURONTIN ) 100 MG capsule; Take 1 capsule (100 mg total) by mouth 3 (three) times daily.  Dispense: 90 capsule; Refill: 0  2. Stage 3b chronic kidney disease (CKD) (HCC) Review of nephrology note, will maintain patient on Farxiga  and losartan  to delay kidney function. Advised patient to maintain adequate hydration status. Will repeat BMP at next visit.  - dapagliflozin  propanediol (FARXIGA ) 10 MG TABS tablet; Take 1 tablet (10 mg total) by mouth daily.  Dispense: 30 tablet; Refill: 3 - Basic Metabolic Panel (BMET); Future  3. Hyperlipidemia associated with type 2 diabetes mellitus (HCC) Will check fasting labs next visit to determine updated ASCVD risk score and if Lipitor needs to be adjusted.  - atorvastatin  (LIPITOR) 20 MG tablet; Take 1 tablet (20 mg total) by mouth daily.  Dispense: 90 tablet; Refill: 3 - Lipid Profile; Future  4. Essential hypertension Blood pressure well controlled, with repeat reading 128/73. Patient reports he has been taking amlodipine , losartan  and chlorthalidone . No mention  of chlorthalidone  in nephrology note. Will discontinue chlorthalidone  due to recent eGFR and no bilateral peripheral edema. Advised patient to continue taking losartan , will increase from 50mg  to 100mg  (since removing chlorthalidone ), and continue amlodipine  10mg  daily. Counseled patient to continue checking blood pressures at home. Will check BMP at next visit.  - Basic Metabolic Panel (BMET); Future - amLODipine  (NORVASC ) 10 MG tablet; Take 1  tablet (10 mg total) by mouth daily.  Dispense: 30 tablet; Refill: 0  5. Lower urinary tract symptoms (LUTS) Rx sent to pharmacy on file.  - tamsulosin  (FLOMAX ) 0.4 MG CAPS capsule; Take 1 capsule (0.4 mg total) by mouth daily.  Dispense: 90 capsule; Refill: 3   Return for scheduled appointment.    Wilhelmena Hanson, FNP

## 2024-03-10 NOTE — Patient Instructions (Addendum)
 Diabetes Meds: Glimepiride  1mg  twice a day with food  Metformin  1000mg  twice a day with food  Farxiga  10mg  daily   Hypertension Meds: Losartan  100mg  once a day  Amlodipine  10mg  once a day  STOP chlorthalidone  25mg 

## 2024-03-11 MED ORDER — GABAPENTIN 100 MG PO CAPS: 100.0000 mg | ORAL_CAPSULE | Freq: Three times a day (TID) | ORAL | 0 refills | Status: DC

## 2024-03-11 MED ORDER — METFORMIN HCL 500 MG PO TABS: 500.0000 mg | ORAL_TABLET | Freq: Two times a day (BID) | ORAL | 0 refills | Status: DC

## 2024-03-11 MED ORDER — ALLOPURINOL 100 MG PO TABS: 50.0000 mg | ORAL_TABLET | Freq: Every day | ORAL | 0 refills | Status: DC

## 2024-03-11 MED ORDER — AMLODIPINE BESYLATE 10 MG PO TABS: 10.0000 mg | ORAL_TABLET | Freq: Every day | ORAL | 0 refills | Status: DC

## 2024-03-16 ENCOUNTER — Encounter (HOSPITAL_COMMUNITY)
Admission: RE | Admit: 2024-03-16 | Discharge: 2024-03-16 | Disposition: A | Discharge status: Home / Self Care | Source: Ambulatory Visit

## 2024-03-16 DIAGNOSIS — C61 Malignant neoplasm of prostate: Secondary | ICD-10-CM | POA: Insufficient documentation

## 2024-03-16 MED ORDER — FLOTUFOLASTAT F 18 GALLIUM 296-5846 MBQ/ML IV SOLN
8.4000 | Freq: Once | INTRAVENOUS | Status: AC
  Administered 2024-03-16: 8.4 via INTRAVENOUS

## 2024-03-21 NOTE — Progress Notes (Unsigned)
 [facility] CONSULT NOTE  Patient Care Team: Wilhelmena Hanson, FNP as PCP - General (Family Medicine) Katheleen Palmer, RN as Oncology Nurse Navigator  ASSESSMENT & PLAN:  [name] is a 78 y.o.male with history of prostate cancer, DM2, gout, HTN, HLD, CKD3 being seen at [facility] for prostate cancer.  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 He completed IMRT in 10/2020 with LT-ADT in 10/2021.   Current diagnosis: post-RT local recurrence Initial diagnosis: T2c GG4 iPSA 122 Germline testing: not yet Somatic testing: not yet Previous Treatment: concurrent with LT-ADT (completed ADT in 10/2021)   08/28/20-10/30/20: 1. The prostate, seminal vesicles, and pelvic lymph nodes were initially treated to 45 Gy in 25 fractions of 1.8 Gy  2. The prostate only was boosted to 75 Gy with 15 additional fractions of 2.0 Gy   He has rising PSA since completion of radiation in 2022. Most recent PSA was 1.92 in May 2025 from 0.5 in Aug 2024. PET from 03/16/24 showed local prostate recurrence.  The patient was counseled on the natural history of prostate cancer and the standard treatment options that are available for prostate cancer. Given no distant disease will reach out to Rad onc on location therapy options. He is not interested in surgery as well even if that is an option.  Discussed ADT, extrapolated data from salvage radiation therapy. He has GG4 disease. Discussed potential side effects. We discussed potentially higher risk with older age, especially with CAD, risk for CVA, MI. Discussed especially with erectile dysfunction. After discussion, he will proceed with one dose and assess side effects in a few weeks.   Report he will leave for about 6 months from July to end of the year. Will have NN reach to radiation oncology to see him asap.  Daughter is the interpreter with patient's  permission. Assessment & Plan Malignant neoplasm of prostate (HCC) Will give a dose of leuprolide  Will reach out to radiation oncology for potential local therapy. Follow up next month to assess tolerance of ADT Basline lab today.  Normocytic anemia Ferritin, iron panel, folate, b12 Acute renal injury (HCC) Recommend fluid 60-70 oz per day Repeat lab today Erectile dysfunction following radiation therapy Patient would like to try medication. Trial of low dose cialis as needed.   Orders Placed This Encounter  Procedures   CBC with Differential (Cancer Center Only)    Standing Status:   Future    Number of Occurrences:   1    Expiration Date:   03/24/2025   CMP (Cancer Center only)    Standing Status:   Future    Number of Occurrences:   1    Expiration Date:   03/24/2025   Ferritin    Standing Status:   Future    Number of Occurrences:   1    Expiration Date:   03/24/2025   Folate    Standing Status:   Future    Number of Occurrences:   1    Expiration Date:   03/24/2025   Iron and Iron Binding Capacity ([facility] only)    Standing Status:   Future    Number of Occurrences:   1    Expiration Date:   03/24/2025   Vitamin B12    Standing Status:   Future    Number of Occurrences:   1    Expiration Date:   03/24/2025   Prostate-Specific  AG, Serum    Standing Status:   Future    Number of Occurrences:   1    Expiration Date:   03/24/2025   Testosterone    Standing Status:   Future    Number of Occurrences:   1    Expiration Date:   03/24/2025   Ambulatory referral to Radiation Oncology    Referral Priority:   Urgent    Referral Reason:   Specialty Services Required    Requested Specialty:   Radiation Oncology    Number of Visits Requested:   1    Supportive baseline bone mineral density study and then every 2 years calcium  (1000-1200 mg daily from food and supplements) and vitamin D3 (1000 IU daily) Zometa (5 mg IV annually) for osteopenia (T-score between -1.0 and -2.5)  on ADT after dental clearance. If CRPC, 4 mg every 3 months if having bone metastases. Healthy lifestyle to prevent diabetes and CV disease Aggressive cardiovascular risk management Weight-bearing exercises (30 minutes per day) Limit alcohol consumption and avoid smoking  The total time spent in the appointment was 65 minutes encounter with patients including review of chart and various tests results, discussions about plan of care and coordination of care plan.  All questions were answered. The patient knows to call the clinic with any problems, questions or concerns. No barriers to learning was detected.  Lowanda Ruddy, MD 6/12/20255:26 PM  CHIEF COMPLAINTS/PURPOSE OF CONSULTATION:  Prostate cancer  HISTORY OF PRESENTING ILLNESS:  Caleb Robbins 78 y.o. male is here because of prostate cancer. I have reviewed his chart and materials related to his cancer extensively and collaborated history with the patient. Summary of oncologic history is as follows:   Oncology History  Malignant neoplasm of prostate (HCC)  07/27/2018 Cancer Staging   Out of 12 core biopsies, 10 were positive. All 4+4 GG4. Staging form: Prostate, AJCC 8th Edition - Clinical stage from 07/27/2018: Stage IIIA (cT2c, cN0, cM0, PSA: 122, Grade Group: 4) - Signed by Keitha Pata, PA-C on 04/06/2020   09/20/2018 Initial Diagnosis   Malignant neoplasm of prostate [facility])  He initially met with radiation oncology on 09/20/2018 and planned with ADT concurrent with 8 weeks of prostate IMRT. He received a 6 month Lupron  injection on 09/27/2018. However, the patient went back home to [place] and due to COVID-19 restrictions, was unable to get back into the [place]  in 2021.   03/23/2020 Imaging   Bone scan: NED   03/23/2020 Imaging   CT AP 1. No lymphadenopathy or other evidence of metastatic disease in the abdomen or pelvis. 2. Chronic diffuse bladder wall thickening, probably due to chronic bladder outlet obstruction by the  markedly enlarged heterogeneous prostate. 3. Aortic Atherosclerosis (ICD10-I70.0).   08/28/2020 - 10/30/2020 Radiation Therapy   1. The prostate, seminal vesicles, and pelvic lymph nodes were initially treated to 45 Gy in 25 fractions of 1.8 Gy  2. The prostate only was boosted to 75 Gy with 15 additional fractions of 2.0 Gy    02/12/2024 Tumor Marker   PSA  12/04/20 3.46 04/17/21 1.5 07/17/21 0.84 09/30/21 0.41 12/24/21 0.22 06/18/22 0.16 05/21/23 0.5 02/12/24 1.92   03/16/2024 PET scan   PSMA PET 1. Focal radiotracer activity in the LEFT lobe of the prostate gland could represent prostate cancer recurrence. 2. No evidence of metastatic adenopathy. 3. No visceral metastasis or skeletal metastasis     MEDICAL HISTORY:  Past Medical History:  Diagnosis Date   Diabetes mellitus without complication (HCC)  Hypertension    Prostate cancer [facility])     SURGICAL HISTORY: Past Surgical History:  Procedure Laterality Date   GOLD SEED IMPLANT N/A 08/15/2020   Procedure: GOLD SEED IMPLANT;  Surgeon: Adelbert Homans, MD;  Location: [facility];  Service: Urology;  Laterality: N/A;   SPACE OAR INSTILLATION N/A 08/15/2020   Procedure: SPACE OAR INSTILLATION;  Surgeon: Adelbert Homans, MD;  Location: [facility];  Service: Urology;  Laterality: N/A;    SOCIAL HISTORY: Social History   Socioeconomic History   Marital status: Married    Spouse name: Not on file   Number of children: 4   Years of education: Not on file   Highest education level: Not on file  Occupational History   Not on file  Tobacco Use   Smoking status: Never    Passive exposure: Never   Smokeless tobacco: Never  Vaping Use   Vaping status: Never Used  Substance and Sexual Activity   Alcohol use: No   Drug use: No   Sexual activity: Not Currently    Partners: Female  Other Topics Concern   Not on file  Social History Narrative   Not on file   Social Drivers of Health   Financial Resource Strain:  Not on file  Food Insecurity: Not on file  Transportation Needs: Not on file  Physical Activity: Not on file  Stress: Not on file  Social Connections: Unknown (02/13/2022)   Received from [facility]   Social Network    Social Network: Not on file  Intimate Partner Violence: Not At Risk (06/30/2023)   Received from [facility]   HITS    Over the last 12 months how often did your partner physically hurt you?: Never    Over the last 12 months how often did your partner insult you or talk down to you?: Never    Over the last 12 months how often did your partner threaten you with physical harm?: Never    Over the last 12 months how often did your partner scream or curse at you?: Never    FAMILY HISTORY: Family History  Problem Relation Age of Onset   Prostate cancer Brother    Breast cancer Neg Hx    Colon cancer Neg Hx    Pancreatic cancer Neg Hx     ALLERGIES:  has no known allergies.  MEDICATIONS:  Current Outpatient Medications  Medication Sig Dispense Refill   allopurinol  (ZYLOPRIM ) 100 MG tablet Take 0.5 tablets (50 mg total) by mouth daily. 30 tablet 0   amLODipine  (NORVASC ) 10 MG tablet Take 1 tablet (10 mg total) by mouth daily. 30 tablet 0   atorvastatin  (LIPITOR) 20 MG tablet Take 1 tablet (20 mg total) by mouth daily. 90 tablet 3   brimonidine-timolol (COMBIGAN) 0.2-0.5 % ophthalmic solution Place 1 drop into both eyes daily.     chlorthalidone  (HYGROTON ) 25 MG tablet Take 2 tablets (50 mg total) by mouth daily. 180 tablet 0   dapagliflozin  propanediol (FARXIGA ) 10 MG TABS tablet Take 1 tablet (10 mg total) by mouth daily. 30 tablet 3   gabapentin  (NEURONTIN ) 100 MG capsule Take 1 capsule (100 mg total) by mouth 3 (three) times daily. 90 capsule 0   latanoprost (XALATAN) 0.005 % ophthalmic solution Place 1 drop into both eyes at bedtime.     metFORMIN  (GLUCOPHAGE ) 500 MG tablet Take 1 tablet (500 mg total) by mouth 2 (two) times daily with a meal. 60 tablet 0  Multiple Vitamin (MULTIVITAMIN WITH MINERALS) TABS tablet Take 1 tablet by mouth daily.     tadalafil (CIALIS) 5 MG tablet Take 1 tablet (5 mg total) by mouth daily as needed for erectile dysfunction. 10 tablet 0   tamsulosin  (FLOMAX ) 0.4 MG CAPS capsule Take 1 capsule (0.4 mg total) by mouth daily. 90 capsule 3   No current facility-administered medications for this visit.    REVIEW OF SYSTEMS:   All relevant systems were reviewed with the patient and are negative.  PHYSICAL EXAMINATION: ECOG PERFORMANCE STATUS: 1 - Symptomatic but completely ambulatory  Vitals:   03/24/24 1200  BP: (!) 152/67  Pulse: 70  Resp: 20  Temp: 97.7 F (36.5 C)  SpO2: 98%   Filed Weights   03/24/24 1200  Weight: 193 lb 6.4 oz (87.7 kg)    GENERAL: alert, no distress and comfortable   LABORATORY DATA:  I have reviewed the results of PSA.  RADIOGRAPHIC STUDIES: I have personally reviewed the radiological images as listed and agreed with the findings in the report.

## 2024-03-24 ENCOUNTER — Inpatient Hospital Stay

## 2024-03-24 VITALS — BP 152/67 | HR 70 | Temp 97.7°F | Resp 20 | Wt 193.4 lb

## 2024-03-24 DIAGNOSIS — D649 Anemia, unspecified: Secondary | ICD-10-CM | POA: Diagnosis not present

## 2024-03-24 DIAGNOSIS — N529 Male erectile dysfunction, unspecified: Secondary | ICD-10-CM | POA: Diagnosis not present

## 2024-03-24 DIAGNOSIS — N183 Chronic kidney disease, stage 3 unspecified: Secondary | ICD-10-CM | POA: Insufficient documentation

## 2024-03-24 DIAGNOSIS — C61 Malignant neoplasm of prostate: Secondary | ICD-10-CM | POA: Diagnosis not present

## 2024-03-24 DIAGNOSIS — N5235 Erectile dysfunction following radiation therapy: Secondary | ICD-10-CM | POA: Diagnosis not present

## 2024-03-24 DIAGNOSIS — Z79899 Other long term (current) drug therapy: Secondary | ICD-10-CM | POA: Insufficient documentation

## 2024-03-24 DIAGNOSIS — N179 Acute kidney failure, unspecified: Secondary | ICD-10-CM

## 2024-03-24 DIAGNOSIS — R9721 Rising PSA following treatment for malignant neoplasm of prostate: Secondary | ICD-10-CM | POA: Insufficient documentation

## 2024-03-24 DIAGNOSIS — Z923 Personal history of irradiation: Secondary | ICD-10-CM | POA: Diagnosis not present

## 2024-03-24 DIAGNOSIS — I129 Hypertensive chronic kidney disease with stage 1 through stage 4 chronic kidney disease, or unspecified chronic kidney disease: Secondary | ICD-10-CM | POA: Diagnosis not present

## 2024-03-24 DIAGNOSIS — E1122 Type 2 diabetes mellitus with diabetic chronic kidney disease: Secondary | ICD-10-CM | POA: Insufficient documentation

## 2024-03-24 DIAGNOSIS — Z7984 Long term (current) use of oral hypoglycemic drugs: Secondary | ICD-10-CM | POA: Diagnosis not present

## 2024-03-24 LAB — CMP (CANCER CENTER ONLY)
ALT: 17 U/L (ref 0–44)
AST: 19 U/L (ref 15–41)
Albumin: 4.3 g/dL (ref 3.5–5.0)
Alkaline Phosphatase: 56 U/L (ref 38–126)
Anion gap: 10 (ref 5–15)
BUN: 21 mg/dL (ref 8–23)
CO2: 28 mmol/L (ref 22–32)
Calcium: 10.8 mg/dL — ABNORMAL HIGH (ref 8.9–10.3)
Chloride: 103 mmol/L (ref 98–111)
Creatinine: 1.36 mg/dL — ABNORMAL HIGH (ref 0.61–1.24)
GFR, Estimated: 53 mL/min — ABNORMAL LOW (ref >60)
Glucose, Bld: 85 mg/dL (ref 70–99)
Potassium: 4.6 mmol/L (ref 3.5–5.1)
Sodium: 141 mmol/L (ref 135–145)
Total Bilirubin: 0.7 mg/dL (ref 0.0–1.2)
Total Protein: 8 g/dL (ref 6.5–8.1)

## 2024-03-24 LAB — FOLATE: Folate: 20.7 ng/mL (ref >5.9)

## 2024-03-24 LAB — CBC WITH DIFFERENTIAL (CANCER CENTER ONLY)
Abs Immature Granulocytes: 0.01 10*3/uL (ref 0.00–0.07)
Basophils Absolute: 0 10*3/uL (ref 0.0–0.1)
Basophils Relative: 0 %
Eosinophils Absolute: 0.1 10*3/uL (ref 0.0–0.5)
Eosinophils Relative: 2 %
HCT: 35.1 % — ABNORMAL LOW (ref 39.0–52.0)
Hemoglobin: 12.1 g/dL — ABNORMAL LOW (ref 13.0–17.0)
Immature Granulocytes: 0 %
Lymphocytes Relative: 23 %
Lymphs Abs: 1.1 10*3/uL (ref 0.7–4.0)
MCH: 29.9 pg (ref 26.0–34.0)
MCHC: 34.5 g/dL (ref 30.0–36.0)
MCV: 86.7 fL (ref 80.0–100.0)
Monocytes Absolute: 0.5 10*3/uL (ref 0.1–1.0)
Monocytes Relative: 10 %
Neutro Abs: 3.2 10*3/uL (ref 1.7–7.7)
Neutrophils Relative %: 65 %
Platelet Count: 217 10*3/uL (ref 150–400)
RBC: 4.05 MIL/uL — ABNORMAL LOW (ref 4.22–5.81)
RDW: 12.9 % (ref 11.5–15.5)
WBC Count: 4.9 10*3/uL (ref 4.0–10.5)
nRBC: 0 % (ref 0.0–0.2)

## 2024-03-24 LAB — IRON AND IRON BINDING CAPACITY (CC-WL,HP ONLY)
Iron: 88 ug/dL (ref 45–182)
Saturation Ratios: 21 % (ref 17.9–39.5)
TIBC: 413 ug/dL (ref 250–450)
UIBC: 325 ug/dL

## 2024-03-24 LAB — VITAMIN B12: Vitamin B-12: 747 pg/mL (ref 180–914)

## 2024-03-24 LAB — FERRITIN: Ferritin: 38 ng/mL (ref 24–336)

## 2024-03-24 MED ORDER — TADALAFIL 5 MG PO TABS: 5.0000 mg | ORAL_TABLET | Freq: Every day | ORAL | 0 refills | Status: DC | PRN

## 2024-03-24 NOTE — Assessment & Plan Note (Addendum)
 Will give a dose of leuprolide  Will reach out to radiation oncology for potential local therapy. Follow up next month to assess tolerance of ADT Basline lab today.

## 2024-03-24 NOTE — Progress Notes (Signed)
 PATIENT NAVIGATOR PROGRESS NOTE  Name: Caleb Robbins Date: 03/24/2024 MRN: [id number]  DOB: [birth year]   Reason for visit:  Post-RT local recurrence   Treatment Recommendations: ADT (Scheduled for 6/13) Referral to Radiation Oncology     Patient is scheduled for start of ST-ADT on 6/13.  Radiation Oncology referral submitted.  Plan of car e in progress.  Will continue to follow.

## 2024-03-24 NOTE — Assessment & Plan Note (Addendum)
 Patient would like to try medication. Trial of low dose cialis as needed.

## 2024-03-25 ENCOUNTER — Telehealth: Payer: Self-pay | Admitting: Radiation Oncology

## 2024-03-25 ENCOUNTER — Inpatient Hospital Stay

## 2024-03-25 VITALS — BP 164/78 | HR 68 | Temp 98.6°F | Resp 18

## 2024-03-25 DIAGNOSIS — C61 Malignant neoplasm of prostate: Secondary | ICD-10-CM | POA: Diagnosis not present

## 2024-03-25 LAB — PROSTATE-SPECIFIC AG, SERUM (LABCORP): Prostate Specific Ag, Serum: 3.1 ng/mL (ref 0.0–4.0)

## 2024-03-25 LAB — TESTOSTERONE: Testosterone: 406 ng/dL (ref 264–916)

## 2024-03-25 MED ORDER — LEUPROLIDE ACETATE (6 MONTH) 45 MG IM KIT
45.0000 mg | PACK | Freq: Once | INTRAMUSCULAR | Status: AC
  Administered 2024-03-25: 45 mg via INTRAMUSCULAR
  Filled 2024-03-25: qty 45

## 2024-03-25 NOTE — Telephone Encounter (Signed)
 Spoke with pt's daughter Daivd Dub to schedule consult for pt. She agreed to telephone consult since pt has another appt close to this time in [place] but they do not want further delay due to upcoming travel. Daughter advised she will be available for consult with pt and asked to call her number for telephone consult 6/17 @ 9am.

## 2024-03-25 NOTE — Progress Notes (Signed)
 GU Location of Tumor / Histology: Prostate Ca (biochemical recurrence)  If Prostate Cancer, Gleason Score is (4 + 4) and PSA is (1.92 on 02/2024)  Caleb Robbins presented as referral from Dr. Janeann Mean ([facility]) elevated PSA.  03/16/2024 Dr. Janeann Mean NM PET (PSMA) Skull to Mid Thigh CLINICAL DATA:  Prostate carcinoma with biochemical recurrence. PSA equal 1.92. Prior radiation therapy Gleason 8 carcinoma  IMPRESSION: 1. Focal radiotracer activity in the LEFT lobe of the prostate gland could represent prostate cancer recurrence. 2. No evidence of metastatic adenopathy. 3. No visceral metastasis or skeletal metastasis    Past/Anticipated interventions by urology, if any: NA  Past/Anticipated interventions by medical oncology, if any:   Dr. Janeann Mean   Weight changes, if any: {:18581}  IPSS: SHIM:  Bowel/Bladder complaints, if any: {:18581}   Nausea/Vomiting, if any: {:18581}  Pain issues, if any:  {:18581}  SAFETY ISSUES: Prior radiation? Yes, 08/28/20-10/30/20: 1. The prostate, seminal vesicles, and pelvic lymph nodes were initially treated to 45 Gy in 25 fractions of 1.8 Gy  2. The prostate only was boosted to 75 Gy with 15 additional fractions of 2.0 Gy. Pacemaker/ICD? {:18581} Possible current pregnancy? Male Is the patient on methotrexate? No  Current Complaints / other details:

## 2024-03-28 NOTE — Progress Notes (Signed)
 Radiation Oncology         (336) [phone] ________________________________  Re-Consultation - Conducted via telephone due to current COVID-19 concerns for limiting patient exposure  Name: Caleb Robbins MRN: [id number]  Date of Service: 03/29/2024 DOB: [birth year]  CC:Towana Small, FNP  Tina Pauletta BROCKS, MD   REFERRING PHYSICIAN: Tina Pauletta BROCKS, MD  DIAGNOSIS: 78 y.o. 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    ICD-10-CM   1. Recurrent adenocarcinoma of prostate (HCC)  C61     2. Recurrent prostate adenocarcinoma (HCC)  C61       HISTORY OF PRESENT ILLNESS: Caleb Robbins is a 78 y.o. male seen at the request of Dr. Tina. We last saw the patient in 10/2020 for definitive treatment of his prostate cancer. In summary, he was initially diagnosed with Gleason 4+4 prostate cancer in 07/2018 with a PSA of 96. We met the patient in 09/2018, and he agreed to proceed with LT-ADT concurrent with 8 weeks of prostate IMRT. He received his first 45-month Lupron  injection on 09/27/18. Following this, however, he returned home to [place] and due to COVID-19 restrictions, was unable to get back into the [place]  until 2021. He returned to Dr. Ottelin in 02/2020, and a repeat PSA at that time showed a further increase to 122, since he has not had any further ADT since 2019. We met back with the patient in 03/2020 and restaging CT and bone scan from 03/2020 were negative for metastatic disease. He resumed ADT and his PSA responded well, decreasing to 30.6 by 05/2020. He subsequently began IMRT on 08/28/20 and completed the daily radiation on 10/30/20. His last dose of ADT was given in 10/2021.  His PSA reached a nadir of 0.16 in 06/2022 but since that time, his PSA has unfortunately begun to rapidly increase, to 0.5 in 05/2023 and most recently to 1.92 in 02/2024. A restaging PSMA PET scan was performed 03/16/24 and showed  focal radiotracer activity in the left lobe of the prostate gland but no evidence of metastatic adenopathy, visceral metastasis, or skeletal metastasis. He met with Dr. Tina in consult on 03/24/24 and was restarted on ADT with Lupron  03/25/24.  He is leaving to go back home to [place] for 6 months, beginning 04/21/24, so he has been kindly referred to us  today to discuss the role of salvage stereotactic body radiotherapy to the prostate, with hopes to complete the treatments prior to leaving the country.  He is accompanied by his daughter, Caleb Robbins, who serves as a Nurse, learning disability for him for today's visit.  PREVIOUS RADIATION THERAPY: Yes  08/28/20 - 10/30/20:  1. The prostate, seminal vesicles, and pelvic lymph nodes were initially treated to 45 Gy in 25 fractions of 1.8 Gy  2. The prostate only was boosted to 75 Gy with 15 additional fractions of 2.0 Gy   PAST MEDICAL HISTORY:  Past Medical History:  Diagnosis Date   Diabetes mellitus without complication (HCC)    Hypertension    Prostate cancer (HCC)       PAST SURGICAL HISTORY: Past Surgical History:  Procedure Laterality Date   GOLD SEED IMPLANT N/A 08/15/2020   Procedure: GOLD SEED IMPLANT;  Surgeon: Devere Lonni Righter, MD;  Location: [facility];  Service: Urology;  Laterality: N/A;   SPACE OAR INSTILLATION N/A 08/15/2020   Procedure: SPACE OAR INSTILLATION;  Surgeon: Devere Lonni Righter, MD;  Location: [facility];  Service: Urology;  Laterality: N/A;    FAMILY HISTORY:  Family History  Problem Relation Age of Onset   Prostate cancer Brother    Breast cancer Neg Hx    Colon cancer Neg Hx    Pancreatic cancer Neg Hx     SOCIAL HISTORY:  Social History   Socioeconomic History   Marital status: Married    Spouse name: Not on file   Number of children: 4   Years of education: Not on file   Highest education level: Not on file  Occupational History   Not on file  Tobacco Use   Smoking status: Never    Passive exposure: Never    Smokeless tobacco: Never  Vaping Use   Vaping status: Never Used  Substance and Sexual Activity   Alcohol use: No   Drug use: No   Sexual activity: Not Currently    Partners: Female  Other Topics Concern   Not on file  Social History Narrative   Not on file   Social Drivers of Health   Financial Resource Strain: Not on file  Food Insecurity: No Food Insecurity (03/29/2024)   Hunger Vital Sign    Worried About Running Out of Food in the Last Year: Never true    Ran Out of Food in the Last Year: Never true  Transportation Needs: No Transportation Needs (03/29/2024)   PRAPARE - Administrator, Civil Service (Medical): No    Lack of Transportation (Non-Medical): No  Physical Activity: Not on file  Stress: Not on file  Social Connections: Unknown (02/13/2022)   Received from [facility]   Social Network    Social Network: Not on file  Intimate Partner Violence: Not At Risk (03/29/2024)   Humiliation, Afraid, Rape, and Kick questionnaire    Fear of Current or Ex-Partner: No    Emotionally Abused: No    Physically Abused: No    Sexually Abused: No    ALLERGIES: Patient has no known allergies.  MEDICATIONS:  Current Outpatient Medications  Medication Sig Dispense Refill   allopurinol  (ZYLOPRIM ) 100 MG tablet Take 0.5 tablets (50 mg total) by mouth daily. 30 tablet 0   amLODipine  (NORVASC ) 10 MG tablet Take 1 tablet (10 mg total) by mouth daily. 30 tablet 0   atorvastatin  (LIPITOR) 20 MG tablet Take 1 tablet (20 mg total) by mouth daily. 90 tablet 3   brimonidine-timolol (COMBIGAN) 0.2-0.5 % ophthalmic solution Place 1 drop into both eyes daily.     calcitRIOL (ROCALTROL) 0.25 MCG capsule Take 0.25 mcg by mouth daily.     chlorthalidone  (HYGROTON ) 25 MG tablet Take 2 tablets (50 mg total) by mouth daily. 180 tablet 0   dapagliflozin  propanediol (FARXIGA ) 10 MG TABS tablet Take 1 tablet (10 mg total) by mouth daily. 30 tablet 3   gabapentin  (NEURONTIN ) 100 MG  capsule Take 1 capsule (100 mg total) by mouth 3 (three) times daily. 90 capsule 0   latanoprost (XALATAN) 0.005 % ophthalmic solution Place 1 drop into both eyes at bedtime.     metFORMIN  (GLUCOPHAGE ) 500 MG tablet Take 1 tablet (500 mg total) by mouth 2 (two) times daily with a meal. 60 tablet 0   Multiple Vitamin (MULTIVITAMIN WITH MINERALS) TABS tablet Take 1 tablet by mouth daily.     tadalafil  (CIALIS ) 5 MG tablet Take 1 tablet (5 mg total) by mouth daily as needed for erectile dysfunction. 10 tablet 0   tamsulosin  (FLOMAX ) 0.4 MG CAPS capsule Take 1 capsule (0.4 mg total)  by mouth daily. 90 capsule 3   No current facility-administered medications for this encounter.    REVIEW OF SYSTEMS:  On review of systems, obtained with the assistance of his daughter, Caleb Robbins, who serves as a Nurse, learning disability for him, the patient reports that he is doing well overall. He denies any chest pain, shortness of breath, cough, fevers, chills, night sweats, unintended weight changes. He denies any bowel or bladder disturbances, and denies abdominal pain, nausea or vomiting. He denies any new musculoskeletal or joint aches or pains. A complete review of systems is obtained and is otherwise negative.    PHYSICAL EXAM:  Wt Readings from Last 3 Encounters:  03/29/24 193 lb (87.5 kg)  03/24/24 193 lb 6.4 oz (87.7 kg)  03/10/24 192 lb (87.1 kg)   Temp Readings from Last 3 Encounters:  03/25/24 98.6 F (37 C) (Oral)  03/24/24 97.7 F (36.5 C)  03/10/24 98.8 F (37.1 C) (Oral)   BP Readings from Last 3 Encounters:  03/25/24 (!) 164/78  03/24/24 (!) 152/67  03/10/24 128/73   Pulse Readings from Last 3 Encounters:  03/25/24 68  03/24/24 70  03/10/24 78   Pain Assessment Pain Score: 0-No pain/10  Physical exam not performed in light of telephone consult visit format.   KPS = 90  100 - Normal; no complaints; no evidence of disease. 90   - Able to carry on normal activity; minor signs or symptoms of  disease. 80   - Normal activity with effort; some signs or symptoms of disease. 29   - Cares for self; unable to carry on normal activity or to do active work. 60   - Requires occasional assistance, but is able to care for most of his personal needs. 50   - Requires considerable assistance and frequent medical care. 40   - Disabled; requires special care and assistance. 30   - Severely disabled; hospital admission is indicated although death not imminent. 20   - Very sick; hospital admission necessary; active supportive treatment necessary. 10   - Moribund; fatal processes progressing rapidly. 0     - Dead  Karnofsky DA, Abelmann WH, Craver LS and Burchenal JH [phone]) The use of the nitrogen mustards in the palliative treatment of carcinoma: with particular reference to bronchogenic carcinoma Cancer 1 634-56  LABORATORY DATA:  Lab Results  Component Value Date   WBC 4.9 03/24/2024   HGB 12.1 (L) 03/24/2024   HCT 35.1 (L) 03/24/2024   MCV 86.7 03/24/2024   PLT 217 03/24/2024   Lab Results  Component Value Date   NA 141 03/24/2024   K 4.6 03/24/2024   CL 103 03/24/2024   CO2 28 03/24/2024   Lab Results  Component Value Date   ALT 17 03/24/2024   AST 19 03/24/2024   ALKPHOS 56 03/24/2024   BILITOT 0.7 03/24/2024     RADIOGRAPHY: NM PET (PSMA) SKULL TO MID THIGH Result Date: 03/16/2024 CLINICAL DATA:  Prostate carcinoma with biochemical recurrence. PSA equal 1.92. Prior radiation therapy Gleason 8 carcinoma EXAM: NUCLEAR MEDICINE PET SKULL BASE TO THIGH TECHNIQUE: 8.4 mCi Flotufolastat (Posluma ) was injected intravenously. Full-ring PET imaging was performed from the skull base to thigh after the radiotracer. CT data was obtained and used for attenuation correction and anatomic localization. COMPARISON:  None Available. FINDINGS: NECK No radiotracer activity in neck lymph nodes. Incidental CT finding: None. CHEST No radiotracer accumulation within mediastinal or hilar lymph nodes. No  suspicious pulmonary nodules on the CT scan. Incidental CT  finding: None. ABDOMEN/PELVIS Prostate: Moderate asymmetric radiotracer activity within the anterior inferior LEFT lobe of the prostate gland SUV max 7.0 (image 188). Fiducial markers within the prostate gland. Lymph nodes: No abnormal radiotracer accumulation within pelvic or abdominal nodes. Liver: No evidence of liver metastasis. Incidental CT finding: None. SKELETON No focal activity to suggest skeletal metastasis. IMPRESSION: 1. Focal radiotracer activity in the LEFT lobe of the prostate gland could represent prostate cancer recurrence. 2. No evidence of metastatic adenopathy. 3. No visceral metastasis or skeletal metastasis Electronically Signed   By: Jackquline Boxer M.D.   On: 03/16/2024 16:53      IMPRESSION/PLAN: 1. 78 y.o. 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  Today, we talked to the patient and his daughter, Caleb Robbins, about the findings and workup thus far. We discussed the natural history of locally recurrent prostate cancer and general treatment, highlighting the role of salvage stereotactic body radiotherapy in the management. We discussed the available radiation techniques, and focused on the details and logistics of delivery.  The recommendation is for a 5 fraction course of stereotactic body radiotherapy (SBRT) to the prostate, concurrent with ST-ADT.  He has already started the ADT on 03/25/2024 so we reviewed the anticipated acute and late sequelae associated with radiation in this setting. The patient and his daughter were encouraged to ask questions that were answered to their stated satisfaction.  At the end of our conversation, he is in agreement to proceed with the recommended 5 fraction course of stereotactic body radiotherapy (SBRT) to the prostate, concurrent with ST-ADT.  They appear to have a good understanding of  his disease and our treatment recommendations which are of curative intent.  He has already started the ADT on 03/25/2024 and is tentatively scheduled for CT simulation/treatment planning at 11 AM on Friday, 04/01/2024, in anticipation of beginning his treatments on 04/08/2024 and completing the treatments on 04/20/2024, prior to him leaving the country on 04/21/2024.  He will sign formal written consent to proceed at the time of CT simulation on 04/01/2024 and a copy of this document will be placed in his medical record.  We will share our discussion with Dr. Tina and proceed with treatment planning accordingly.  We enjoyed meeting with him and his daughter again today and look forward to continue to participate in his care.   Given current concerns for patient exposure during the COVID-19 pandemic, this encounter was conducted via telephone. The patient was notified in advance and was offered a WebEX meeting to allow for face to face communication but unfortunately reported that he did not have the appropriate resources/technology to support such a visit and instead preferred to proceed with telephone consult. The patient has given verbal consent for this type of encounter. The attendants for this meeting include Donnice Barge MD, Sabra Sherwood RIGGERS, patient, Lenwood Balsam and his daughter, Caleb Robbins During the encounter, Donnice Barge MD and Sabra Sherwood PA-C were located at [facility] Radiation Oncology Department.  Patient, Caleb Robbins and his daughter, Caleb Robbins, were located at home.   We personally spent 60 minutes in this encounter including chart review, reviewing radiological studies, meeting face-to-face with the patient, entering orders and completing documentation.    Sabra MICAEL Sherwood, PA-C    Donnice Barge, MD  [facility]  Radiation Oncology Direct Dial: [phone]  Fax: [phone] [email or web address]  Skype  [email or web address]   This document serves as  a record of  services personally performed by Donnice Barge, MD and Sabra Rusk, PA-C. It was created on their behalf by Izetta Neither, a trained medical scribe. The creation of this record is based on the scribe's personal observations and the provider's statements to them. This document has been checked and approved by the attending provider.

## 2024-03-28 NOTE — Progress Notes (Signed)
  Radiation Oncology         (336) [phone] ________________________________  Name: Kenley Troop MRN: [id number]  Date: 04/01/2024  DOB: 1946-06-20  STEREOTACTIC BODY RADIOTHERAPY SIMULATION AND TREATMENT PLANNING NOTE    ICD-10-CM   1. Malignant neoplasm of prostate (HCC)  C61       DIAGNOSIS:  78 y.o. gentleman with isolated locally recurrent adenocarcinoma of the prostate   NARRATIVE:  The patient was brought to the CT Simulation planning suite.  Identity was confirmed.  All relevant records and images related to the planned course of therapy were reviewed.  The patient freely provided informed written consent to proceed with treatment after reviewing the details related to the planned course of therapy. The consent form was witnessed and verified by the simulation staff.  Then, the patient was set-up in a stable reproducible  supine position for radiation therapy.  A BodyFix immobilization pillow was fabricated for reproducible positioning.  Surface markings were placed.  The CT images were loaded into the planning software.  The gross target volumes (GTV) and planning target volumes (PTV) were delinieated, and avoidance structures were contoured.  Treatment planning then occurred.  The radiation prescription was entered and confirmed.  A total of two complex treatment devices were fabricated in the form of the BodyFix immobilization pillow and a neck accuform cushion.  I have requested : 3D Simulation  I have requested a DVH of the following structures: targets and all normal structures near the target including bladder, rectum, urethra and others as noted on the radiation plan to maintain doses in adherence with established limits  SPECIAL TREATMENT PROCEDURE:  The planned course of therapy using radiation constitutes a special treatment procedure. Special care is required in the management of this patient for the following reasons. High dose per fraction requiring special monitoring for  increased toxicities of treatment including daily imaging..  The special nature of the planned course of radiotherapy will require increased physician supervision and oversight to ensure patient's safety with optimal treatment outcomes.    This requires extended time and effort.    PLAN:  The patient will receive 36.25 Gy in 5 fractions.  ________________________________  Trilby Fujisawa Lorri Rota, M.D.

## 2024-03-29 ENCOUNTER — Encounter: Payer: Self-pay | Admitting: Radiation Oncology

## 2024-03-29 ENCOUNTER — Ambulatory Visit
Admission: RE | Admit: 2024-03-29 | Discharge: 2024-03-29 | Disposition: A | Discharge status: Home / Self Care | Source: Ambulatory Visit | Attending: Radiation Oncology | Admitting: Radiation Oncology

## 2024-03-29 ENCOUNTER — Ambulatory Visit

## 2024-03-29 ENCOUNTER — Ambulatory Visit: Admitting: Family Medicine

## 2024-03-29 VITALS — Ht 67.0 in | Wt 193.0 lb

## 2024-03-29 DIAGNOSIS — E1165 Type 2 diabetes mellitus with hyperglycemia: Secondary | ICD-10-CM

## 2024-03-29 DIAGNOSIS — E1169 Type 2 diabetes mellitus with other specified complication: Secondary | ICD-10-CM

## 2024-03-29 DIAGNOSIS — N1832 Chronic kidney disease, stage 3b: Secondary | ICD-10-CM

## 2024-03-29 DIAGNOSIS — C61 Malignant neoplasm of prostate: Secondary | ICD-10-CM

## 2024-03-29 DIAGNOSIS — I10 Essential (primary) hypertension: Secondary | ICD-10-CM

## 2024-03-30 LAB — BASIC METABOLIC PANEL WITH GFR
BUN/Creatinine Ratio: 11 (ref 10–24)
BUN: 18 mg/dL (ref 8–27)
CO2: 21 mmol/L (ref 20–29)
Calcium: 10.1 mg/dL (ref 8.6–10.2)
Chloride: 105 mmol/L (ref 96–106)
Creatinine, Ser: 1.61 mg/dL — ABNORMAL HIGH (ref 0.76–1.27)
Glucose: 152 mg/dL — ABNORMAL HIGH (ref 70–99)
Potassium: 4.3 mmol/L (ref 3.5–5.2)
Sodium: 141 mmol/L (ref 134–144)
eGFR: 44 mL/min/{1.73_m2} — ABNORMAL LOW (ref >59)

## 2024-03-30 LAB — LIPID PANEL
Chol/HDL Ratio: 2.8 ratio (ref 0.0–5.0)
Cholesterol, Total: 135 mg/dL (ref 100–199)
HDL: 48 mg/dL (ref >39)
LDL Chol Calc (NIH): 74 mg/dL (ref 0–99)
Triglycerides: 64 mg/dL (ref 0–149)
VLDL Cholesterol Cal: 13 mg/dL (ref 5–40)

## 2024-04-01 ENCOUNTER — Ambulatory Visit
Admission: RE | Admit: 2024-04-01 | Discharge: 2024-04-01 | Disposition: A | Discharge status: Home / Self Care | Source: Ambulatory Visit | Attending: Urology | Admitting: Urology

## 2024-04-01 DIAGNOSIS — C61 Malignant neoplasm of prostate: Secondary | ICD-10-CM | POA: Diagnosis present

## 2024-04-01 DIAGNOSIS — Z510 Encounter for antineoplastic radiation therapy: Secondary | ICD-10-CM | POA: Diagnosis present

## 2024-04-04 DIAGNOSIS — Z510 Encounter for antineoplastic radiation therapy: Secondary | ICD-10-CM | POA: Diagnosis not present

## 2024-04-06 ENCOUNTER — Encounter: Payer: Self-pay | Admitting: Family Medicine

## 2024-04-06 ENCOUNTER — Ambulatory Visit (INDEPENDENT_AMBULATORY_CARE_PROVIDER_SITE_OTHER): Admitting: Family Medicine

## 2024-04-06 VITALS — BP 153/88 | HR 76 | Temp 98.4°F | Wt 192.0 lb

## 2024-04-06 DIAGNOSIS — R399 Unspecified symptoms and signs involving the genitourinary system: Secondary | ICD-10-CM | POA: Diagnosis not present

## 2024-04-06 DIAGNOSIS — E1169 Type 2 diabetes mellitus with other specified complication: Secondary | ICD-10-CM | POA: Diagnosis not present

## 2024-04-06 DIAGNOSIS — I10 Essential (primary) hypertension: Secondary | ICD-10-CM | POA: Diagnosis not present

## 2024-04-06 DIAGNOSIS — Z7984 Long term (current) use of oral hypoglycemic drugs: Secondary | ICD-10-CM

## 2024-04-06 DIAGNOSIS — E1165 Type 2 diabetes mellitus with hyperglycemia: Secondary | ICD-10-CM | POA: Diagnosis not present

## 2024-04-06 DIAGNOSIS — N1832 Chronic kidney disease, stage 3b: Secondary | ICD-10-CM

## 2024-04-06 DIAGNOSIS — E785 Hyperlipidemia, unspecified: Secondary | ICD-10-CM

## 2024-04-06 MED ORDER — DAPAGLIFLOZIN PROPANEDIOL 10 MG PO TABS
10.0000 mg | ORAL_TABLET | Freq: Every day | ORAL | 1 refills | Status: DC
Start: 2024-04-06 — End: 2024-04-12

## 2024-04-06 MED ORDER — CALCITRIOL 0.25 MCG PO CAPS: 0.2500 ug | ORAL_CAPSULE | Freq: Every day | ORAL | 1 refills | Status: DC

## 2024-04-06 MED ORDER — ATORVASTATIN CALCIUM 20 MG PO TABS: 20.0000 mg | ORAL_TABLET | Freq: Every day | ORAL | 3 refills | Status: DC

## 2024-04-06 MED ORDER — AMLODIPINE BESYLATE 10 MG PO TABS: 10.0000 mg | ORAL_TABLET | Freq: Every day | ORAL | 1 refills | Status: DC

## 2024-04-06 MED ORDER — ALLOPURINOL 100 MG PO TABS: 50.0000 mg | ORAL_TABLET | Freq: Every day | ORAL | 1 refills | Status: DC

## 2024-04-06 MED ORDER — LOSARTAN POTASSIUM 100 MG PO TABS: 100.0000 mg | ORAL_TABLET | Freq: Every day | ORAL | 1 refills | Status: DC

## 2024-04-06 MED ORDER — TADALAFIL 5 MG PO TABS: 5.0000 mg | ORAL_TABLET | Freq: Every day | ORAL | 0 refills | Status: DC | PRN

## 2024-04-06 MED ORDER — TAMSULOSIN HCL 0.4 MG PO CAPS: 0.4000 mg | ORAL_CAPSULE | Freq: Every day | ORAL | 3 refills | Status: DC

## 2024-04-06 MED ORDER — METFORMIN HCL 500 MG PO TABS: 500.0000 mg | ORAL_TABLET | Freq: Every day | ORAL | 1 refills | Status: DC

## 2024-04-06 MED ORDER — GLIMEPIRIDE 1 MG PO TABS: ORAL_TABLET | ORAL | 1 refills | Status: DC

## 2024-04-06 MED ORDER — GABAPENTIN 100 MG PO CAPS
100.0000 mg | ORAL_CAPSULE | Freq: Three times a day (TID) | ORAL | 1 refills | Status: DC
Start: 2024-04-06 — End: 2024-04-08

## 2024-04-06 NOTE — Patient Instructions (Addendum)
 DIABETES MEDICATIONS: Glimepiride  1mg  twice a day with food  Metformin  500mg  daily with food  Farxiga  10mg  daily   BLOOD PRESSURE MEDICATIONS: Losartan  100mg  once a day  Amlodipine  10mg  once a day  STOP chlorthalidone  25mg -- can take as needed for lower leg swelling

## 2024-04-06 NOTE — Progress Notes (Signed)
 Established Patient Office Visit  Subjective  Patient ID: Caleb Robbins, male    DOB: 1946-04-05  Age: 78 y.o. MRN: [id number]  Chief Complaint  Patient presents with   Diabetes   Hypertension   Chronic Kidney Disease   Caleb Robbins is a pleasant 78 year old male patient who presents today for follow-up regarding his chronic conditions: including HTN, DM2, and CKD. He is supposed to be going to [place] for 6 months in July 2025.   HTN:  Losartan  100mg  once a day  Amlodipine  10mg  once a day  STOP chlorthalidone  25mg    137/75, 143/71, 135/71, 120/69, 145/75, 150/73, 140/76, 161/78, 164/76, 154/73  DM2:  Glimepiride  1mg  twice a day with food  Metformin  500mg  daily with food  Farxiga  10mg  daily  Blood sugars: 115, 95, 121, 106, 107, 122, 111, 95, 109, 97, 96, 114, 107, 98, 139, 116, 128, 141 Last A1c last checked 03/10/2024 at 7.6%  ROS: see HPI     Objective:    BP (!) 153/88   Pulse 76   Temp 98.4 F (36.9 C) (Oral)   Wt 192 lb (87.1 kg)   SpO2 96%   BMI 30.07 kg/m  BP Readings from Last 3 Encounters:  04/06/24 (!) 153/88  03/25/24 (!) 164/78  03/24/24 (!) 152/67    Physical Exam Vitals reviewed.  Constitutional:      Appearance: Normal appearance.   Cardiovascular:     Rate and Rhythm: Normal rate and regular rhythm.     Pulses: Normal pulses.     Heart sounds: Normal heart sounds.  Pulmonary:     Effort: Pulmonary effort is normal.     Breath sounds: Normal breath sounds.   Musculoskeletal:     Right lower leg: No edema.     Left lower leg: No edema.   Neurological:     Mental Status: He is alert.   Psychiatric:        Mood and Affect: Mood normal.        Behavior: Behavior normal.     Assessment & Plan:   1. Essential hypertension (Primary) Patient presents today with slightly elevated blood pressure, repeat blood pressure slightly improved. Patient in no acute distress and is well-appearing. Denies chest pain, shortness of breath, lower  extremity edema, vision changes, headaches. Cardiovascular exam with heart regular rate and rhythm. Normal heart sounds, no murmurs present. No lower extremity edema present. Lungs clear to auscultation bilaterally. Advised patient to take losartan  100mg  daily & amlodipine  10mg  daily. Advised patient to take chlorthalidone  as needed for peripheral edema. Refills provided today. Advised patient to continue to monitor blood pressure and send a MyChart message of his blood pressure readings in 2 weeks.    - losartan  (COZAAR ) 100 MG tablet; Take 1 tablet (100 mg total) by mouth daily.  Dispense: 90 tablet; Refill: 1 - amLODipine  (NORVASC ) 10 MG tablet; Take 1 tablet (10 mg total) by mouth daily.  Dispense: 90 tablet; Refill: 1  2. Type 2 diabetes mellitus with hyperglycemia, without long-term current use of insulin (HCC) Fasting blood sugars are well controlled and A1c is at goal at less than 8%. Advised patient to continue taking metformin  500mg  daily, Farxiga  10mg  daily and glimepiride  1mg  twice daily with food. Next A1c due 06/10/2024. Discussed lifestyle modifications-- including healthy diet and daily exercise. Refills sent to pharmacy on file.  - gabapentin  (NEURONTIN ) 100 MG capsule; Take 1 capsule (100 mg total) by mouth 3 (three) times daily.  Dispense: 270 capsule; Refill: 1 -  metFORMIN  (GLUCOPHAGE ) 500 MG tablet; Take 1 tablet (500 mg total) by mouth daily with breakfast.  Dispense: 90 tablet; Refill: 1 - dapagliflozin  propanediol (FARXIGA ) 10 MG TABS tablet; Take 1 tablet (10 mg total) by mouth daily.  Dispense: 90 tablet; Refill: 1 - glimepiride  (AMARYL ) 1 MG tablet; TAKE 1 TABLET BY MOUTH TWICE DAILY  Dispense: 180 tablet; Refill: 1  3. Lower urinary tract symptoms (LUTS) Refills sent to pharmacy.  - tamsulosin  (FLOMAX ) 0.4 MG CAPS capsule; Take 1 capsule (0.4 mg total) by mouth daily.  Dispense: 90 capsule; Refill: 3 - tadalafil  (CIALIS ) 5 MG tablet; Take 1 tablet (5 mg total) by mouth daily  as needed for erectile dysfunction.  Dispense: 30 tablet; Refill: 0  4. Stage 3b chronic kidney disease (CKD) (HCC) Refills sent to pharmacy.  - dapagliflozin  propanediol (FARXIGA ) 10 MG TABS tablet; Take 1 tablet (10 mg total) by mouth daily.  Dispense: 90 tablet; Refill: 1  5. Hyperlipidemia associated with type 2 diabetes mellitus (HCC) Refills sent to pharmacy.  - atorvastatin  (LIPITOR) 20 MG tablet; Take 1 tablet (20 mg total) by mouth daily.  Dispense: 90 tablet; Refill: 3   Return in about 7 months (around 10/23/2024) for chronic conditions .    Evalene Arts, FNP

## 2024-04-08 ENCOUNTER — Telehealth: Payer: Self-pay

## 2024-04-08 ENCOUNTER — Ambulatory Visit
Admission: RE | Admit: 2024-04-08 | Discharge: 2024-04-08 | Disposition: A | Discharge status: Home / Self Care | Source: Ambulatory Visit | Attending: Radiation Oncology | Admitting: Radiation Oncology

## 2024-04-08 ENCOUNTER — Other Ambulatory Visit: Payer: Self-pay | Admitting: Family Medicine

## 2024-04-08 ENCOUNTER — Other Ambulatory Visit: Payer: Self-pay

## 2024-04-08 DIAGNOSIS — Z510 Encounter for antineoplastic radiation therapy: Secondary | ICD-10-CM | POA: Diagnosis not present

## 2024-04-08 DIAGNOSIS — N1832 Chronic kidney disease, stage 3b: Secondary | ICD-10-CM

## 2024-04-08 DIAGNOSIS — E1165 Type 2 diabetes mellitus with hyperglycemia: Secondary | ICD-10-CM

## 2024-04-08 LAB — RAD ONC ARIA SESSION SUMMARY
Course Elapsed Days: 0
Plan Fractions Treated to Date: 1
Plan Prescribed Dose Per Fraction: 7.25 Gy
Plan Total Fractions Prescribed: 5
Plan Total Prescribed Dose: 36.25 Gy
Reference Point Dosage Given to Date: 7.25 Gy
Reference Point Session Dosage Given: 7.25 Gy
Session Number: 1

## 2024-04-08 MED ORDER — GABAPENTIN 100 MG PO CAPS: 100.0000 mg | ORAL_CAPSULE | Freq: Three times a day (TID) | ORAL | 0 refills | Status: DC

## 2024-04-08 NOTE — Telephone Encounter (Signed)
-----   Message from Evalene Arts sent at 04/06/2024  1:08 PM EDT ----- Would you be able to call his pharmacy and ensure that they can give him enough for a 43-month supply?  Thanks!

## 2024-04-08 NOTE — Telephone Encounter (Signed)
 Called pharmacy to let them know that the patient is needing refills worth 6 months since he will be going out of the country. They stated that w--e would have to call his insurance for authorization.  I then called patient's insurance [employer] [phone] and they stated that the patient's coverage is only good for 30 days. Therefore, they stated that they would fax us  a form for the provider to fill out and hopefully get an approval.

## 2024-04-11 ENCOUNTER — Ambulatory Visit

## 2024-04-12 ENCOUNTER — Ambulatory Visit
Admission: RE | Admit: 2024-04-12 | Discharge: 2024-04-12 | Disposition: A | Discharge status: Home / Self Care | Source: Ambulatory Visit | Attending: Radiation Oncology | Admitting: Radiation Oncology

## 2024-04-12 ENCOUNTER — Other Ambulatory Visit: Payer: Self-pay

## 2024-04-12 DIAGNOSIS — Z510 Encounter for antineoplastic radiation therapy: Secondary | ICD-10-CM | POA: Insufficient documentation

## 2024-04-12 DIAGNOSIS — C61 Malignant neoplasm of prostate: Secondary | ICD-10-CM | POA: Diagnosis present

## 2024-04-12 DIAGNOSIS — I129 Hypertensive chronic kidney disease with stage 1 through stage 4 chronic kidney disease, or unspecified chronic kidney disease: Secondary | ICD-10-CM | POA: Diagnosis not present

## 2024-04-12 DIAGNOSIS — Z923 Personal history of irradiation: Secondary | ICD-10-CM | POA: Diagnosis not present

## 2024-04-12 DIAGNOSIS — E1122 Type 2 diabetes mellitus with diabetic chronic kidney disease: Secondary | ICD-10-CM | POA: Diagnosis not present

## 2024-04-12 DIAGNOSIS — N1832 Chronic kidney disease, stage 3b: Secondary | ICD-10-CM | POA: Diagnosis not present

## 2024-04-12 LAB — RAD ONC ARIA SESSION SUMMARY
Course Elapsed Days: 4
Plan Fractions Treated to Date: 2
Plan Prescribed Dose Per Fraction: 7.25 Gy
Plan Total Fractions Prescribed: 5
Plan Total Prescribed Dose: 36.25 Gy
Reference Point Dosage Given to Date: 14.5 Gy
Reference Point Session Dosage Given: 7.25 Gy
Session Number: 2

## 2024-04-12 MED ORDER — DAPAGLIFLOZIN PROPANEDIOL 10 MG PO TABS: 10.0000 mg | ORAL_TABLET | Freq: Every day | ORAL | 1 refills | Status: DC

## 2024-04-12 NOTE — Telephone Encounter (Signed)
 Pharmacy claim for Dapagliflozin  Propanediol 10MG  tablets, prescriber [place], has been rejected and requires prior authorization for coverage.  If clinically appropriate, you may change the prescription. A therapeutic alternative may be available. Questions on alternatives? Contact [employer]eds [employer] at [phone]. You can also review the plan's formulary online or call them directly.   Compare the original medication with possible alternatives: Diagnosis  MEDICATION PRIOR AUTH REQUIREMENT * Farxiga  Likely not required  Invokana Likely not required  Jardiance Likely not required  Steglatro Likely not required  Bexagliflozin Likely required  Brenzavvy Likely required

## 2024-04-12 NOTE — Telephone Encounter (Signed)
 Dapagliflozin  is generic for Farxiga . Prescription resent to pharmacy and marked as brand only.

## 2024-04-13 ENCOUNTER — Ambulatory Visit

## 2024-04-14 ENCOUNTER — Ambulatory Visit
Admission: RE | Admit: 2024-04-14 | Discharge: 2024-04-14 | Disposition: A | Discharge status: Home / Self Care | Source: Ambulatory Visit | Attending: Radiation Oncology | Admitting: Radiation Oncology

## 2024-04-14 ENCOUNTER — Other Ambulatory Visit: Payer: Self-pay

## 2024-04-14 DIAGNOSIS — Z510 Encounter for antineoplastic radiation therapy: Secondary | ICD-10-CM | POA: Diagnosis not present

## 2024-04-14 LAB — RAD ONC ARIA SESSION SUMMARY
Course Elapsed Days: 6
Plan Fractions Treated to Date: 3
Plan Prescribed Dose Per Fraction: 7.25 Gy
Plan Total Fractions Prescribed: 5
Plan Total Prescribed Dose: 36.25 Gy
Reference Point Dosage Given to Date: 21.75 Gy
Reference Point Session Dosage Given: 7.25 Gy
Session Number: 3

## 2024-04-17 DIAGNOSIS — Z9189 Other specified personal risk factors, not elsewhere classified: Secondary | ICD-10-CM | POA: Insufficient documentation

## 2024-04-17 NOTE — Assessment & Plan Note (Addendum)
 Continue monitor at home

## 2024-04-17 NOTE — Assessment & Plan Note (Addendum)
 Recommend increase fluid 64 oz per day

## 2024-04-17 NOTE — Assessment & Plan Note (Signed)
 calcium  (1000-1200 mg daily from food and supplements) and vitamin D3 (1000 IU daily) Routine dental care Healthy lifestyle to control diabetes and CV disease Weight-bearing exercises if able (30 minutes per day) Limit alcohol consumption and avoid smoking

## 2024-04-17 NOTE — Progress Notes (Unsigned)
 [facility] OFFICE PROGRESS NOTE  Patient Care Team: Towana Small, FNP as PCP - General (Family Medicine) Vertell Pont, RN as Oncology Nurse Navigator  Caleb Robbins is a 78 y.o.male with history of prostate cancer, DM2, gout, HTN, HLD, CKD3 being seen at [facility] for [facility] cancer.   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 He completed IMRT in 10/2020 with LT-ADT in 10/2021.   Current diagnosis: post-RT local recurrence Initial diagnosis: T2c GG4 iPSA 122 Germline testing: not yet Somatic testing: not yet Previous Treatment: concurrent with LT-ADT (completed ADT in 10/2021)   08/28/20-10/30/20: 1. The prostate, seminal vesicles, and pelvic lymph nodes were initially treated to 45 Gy in 25 fractions of 1.8 Gy  2. The prostate only was boosted to 75 Gy with 15 additional fractions of 2.0 Gy   He has rising PSA since completion of radiation in 2022. Most recent PSA was 1.92 in May 2025 from 0.5 in Aug 2024. PET from 03/16/24 showed local prostate recurrence.  The patient was counseled on the natural history of prostate cancer and the standard treatment options that are available for prostate cancer. Given no distant disease patient elected on SBRT. He is not interested in surgery as well even if that is an option.  Discussed ADT, extrapolated data from salvage radiation therapy. He has GG4 disease. Discussed potential side effects. We discussed potentially higher risk with older age, especially with CAD, risk for CVA, MI. Discussed especially with erectile dysfunction. After discussion, he will proceed with one dose on 6/13 at 6 months dosage. Side effects     Report he will leave for about 6 months from July to end of the year. Will have NN reach to radiation oncology to see him asap.  Daughter is the interpreter with patient's permission.    Assessment & Plan Essential  hypertension Continue monitor at home Stage 3b chronic kidney disease (CKD) (HCC) Recommend increase fluid 64 oz per day At risk for side effect of medication calcium  (1000-1200 mg daily from food and supplements) and vitamin D3 (1000 IU daily) Routine dental care Healthy lifestyle to control diabetes and CV disease Weight-bearing exercises if able (30 minutes per day) Limit alcohol consumption and avoid smoking  No orders of the defined types were placed in this encounter.    Caleb [name], MD  INTERVAL HISTORY: Patient returns for follow-up.  Oncology History  Recurrent prostate adenocarcinoma (HCC)  07/27/2018 Cancer Staging   Out of 12 core biopsies, 10 were positive. All 4+4 GG4. Staging form: Prostate, AJCC 8th Edition - Clinical stage from 07/27/2018: Stage IIIA (cT2c, cN0, cM0, PSA: 122, Grade Group: 4) - Signed by Sherwood Rise, PA-C on 04/06/2020   09/20/2018 Initial Diagnosis   Malignant neoplasm of prostate [facility])  He initially met with radiation oncology on 09/20/2018 and planned with ADT concurrent with 8 weeks of prostate IMRT. He received a 6 month Lupron  injection on 09/27/2018. However, the patient went back home to [place] and due to COVID-19 restrictions, was unable to get back into the [place]  in 2021.   03/23/2020 Imaging   Bone scan: NED   03/23/2020 Imaging   CT AP 1. No lymphadenopathy or other evidence of metastatic disease in the abdomen or pelvis. 2. Chronic diffuse bladder wall thickening, probably due to chronic bladder outlet obstruction by the markedly enlarged heterogeneous prostate. 3. Aortic Atherosclerosis (ICD10-I70.0).   08/28/2020 - 10/30/2020  Radiation Therapy   1. The prostate, seminal vesicles, and pelvic lymph nodes were initially treated to 45 Gy in 25 fractions of 1.8 Gy  2. The prostate only was boosted to 75 Gy with 15 additional fractions of 2.0 Gy    02/12/2024 Tumor Marker   PSA  12/04/20 3.46 04/17/21 1.5 07/17/21 0.84 09/30/21  0.41 12/24/21 0.22 06/18/22 0.16 05/21/23 0.5 02/12/24 1.92   03/16/2024 PET scan   PSMA PET 1. Focal radiotracer activity in the LEFT lobe of the prostate gland could represent prostate cancer recurrence. 2. No evidence of metastatic adenopathy. 3. No visceral metastasis or skeletal metastasis    Past Medical History:  Diagnosis Date   Diabetes mellitus without complication (HCC)    Hypertension    Prostate cancer (HCC)      PHYSICAL EXAMINATION: ECOG PERFORMANCE STATUS: {CHL ONC ECOG ED:[id number]}  There were no vitals filed for this visit. There were no vitals filed for this visit.  GENERAL: alert, no distress and comfortable SKIN: skin color normal and no bruising or petechiae or jaundice on exposed skin EYES: normal, sclera clear OROPHARYNX: no exudate  NECK: No palpable mass LYMPH:  no palpable cervical, axillary lymphadenopathy  LUNGS: clear to auscultation and percussion with normal breathing effort HEART: regular rate & rhythm  ABDOMEN: abdomen soft, non-tender and nondistended. Musculoskeletal: no edema NEURO: no focal motor/sensory deficits  Relevant data reviewed during this visit included ***

## 2024-04-18 ENCOUNTER — Inpatient Hospital Stay

## 2024-04-18 ENCOUNTER — Ambulatory Visit
Admission: RE | Admit: 2024-04-18 | Discharge: 2024-04-18 | Disposition: A | Discharge status: Home / Self Care | Source: Ambulatory Visit | Attending: Radiation Oncology

## 2024-04-18 ENCOUNTER — Ambulatory Visit

## 2024-04-18 ENCOUNTER — Other Ambulatory Visit: Payer: Self-pay

## 2024-04-18 VITALS — BP 160/73 | HR 73 | Temp 97.7°F | Resp 20 | Wt 191.3 lb

## 2024-04-18 DIAGNOSIS — N1832 Chronic kidney disease, stage 3b: Secondary | ICD-10-CM | POA: Insufficient documentation

## 2024-04-18 DIAGNOSIS — Z9189 Other specified personal risk factors, not elsewhere classified: Secondary | ICD-10-CM | POA: Diagnosis not present

## 2024-04-18 DIAGNOSIS — I10 Essential (primary) hypertension: Secondary | ICD-10-CM

## 2024-04-18 DIAGNOSIS — E1122 Type 2 diabetes mellitus with diabetic chronic kidney disease: Secondary | ICD-10-CM | POA: Insufficient documentation

## 2024-04-18 DIAGNOSIS — Z510 Encounter for antineoplastic radiation therapy: Secondary | ICD-10-CM | POA: Diagnosis not present

## 2024-04-18 DIAGNOSIS — Z923 Personal history of irradiation: Secondary | ICD-10-CM | POA: Insufficient documentation

## 2024-04-18 DIAGNOSIS — C61 Malignant neoplasm of prostate: Secondary | ICD-10-CM | POA: Insufficient documentation

## 2024-04-18 DIAGNOSIS — I129 Hypertensive chronic kidney disease with stage 1 through stage 4 chronic kidney disease, or unspecified chronic kidney disease: Secondary | ICD-10-CM | POA: Insufficient documentation

## 2024-04-18 LAB — RAD ONC ARIA SESSION SUMMARY
Course Elapsed Days: 10
Plan Fractions Treated to Date: 4
Plan Prescribed Dose Per Fraction: 7.25 Gy
Plan Total Fractions Prescribed: 5
Plan Total Prescribed Dose: 36.25 Gy
Reference Point Dosage Given to Date: 29 Gy
Reference Point Session Dosage Given: 7.25 Gy
Session Number: 4

## 2024-04-18 NOTE — Assessment & Plan Note (Addendum)
 Return on Dec 15th for labs, visit and leuprolide  CBC, CMP, PSA and testosterone  ordered

## 2024-04-20 ENCOUNTER — Ambulatory Visit
Admission: RE | Admit: 2024-04-20 | Discharge: 2024-04-20 | Disposition: A | Discharge status: Home / Self Care | Source: Ambulatory Visit | Attending: Radiation Oncology

## 2024-04-20 ENCOUNTER — Other Ambulatory Visit: Payer: Self-pay

## 2024-04-20 DIAGNOSIS — Z510 Encounter for antineoplastic radiation therapy: Secondary | ICD-10-CM | POA: Diagnosis not present

## 2024-04-20 LAB — RAD ONC ARIA SESSION SUMMARY
Course Elapsed Days: 12
Plan Fractions Treated to Date: 5
Plan Prescribed Dose Per Fraction: 7.25 Gy
Plan Total Fractions Prescribed: 5
Plan Total Prescribed Dose: 36.25 Gy
Reference Point Dosage Given to Date: 36.25 Gy
Reference Point Session Dosage Given: 7.25 Gy
Session Number: 5

## 2024-04-20 NOTE — Progress Notes (Signed)
 Patient is established with a treatment plan and is actively engaged in care. Nurse Navigator services not currently indicated at this time. Will re-evaluate if needs change or if additional support is requested.

## 2024-04-22 NOTE — Radiation Completion Notes (Addendum)
  Radiation Oncology         [phone] ________________________________  Name: Caleb Robbins MRN: [id number]  Date: 04/20/2024  DOB: 14-Jun-1946  Referring Physician: EVALENE ARTS, M.D. Date of Service: 2024-04-22 Radiation Oncologist: Adina Barge, M.D. [facility]     RADIATION ONCOLOGY END OF TREATMENT NOTE     Diagnosis: 78 y.o. 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   Intent: Curative     ==========DELIVERED PLANS==========  First Treatment Date: 2024-04-08 Last Treatment Date: 2024-04-20   Plan Name: Prostate_SBRT Site: Prostate Technique: SBRT/SRT-IMRT Mode: Photon Dose Per Fraction: 7.25 Gy Prescribed Dose (Delivered / Prescribed): 36.25 Gy / 36.25 Gy Prescribed Fxs (Delivered / Prescribed): 5 / 5     ==========ON TREATMENT VISIT DATES========== 2024-04-08, 2024-04-12, 2024-04-14, 2024-04-18, 2024-04-18, 2024-04-20, 2024-04-20    See weekly On Treatment Notes in Epic for details in the Media tab (listed as Progress notes on the On Treatment Visit Dates listed above).  He tolerated the radiation treatments relatively well with only modest fatigue.  The patient will receive a call in about one month from the radiation oncology department. He will continue follow up with his medical oncologist, Dr. Tina, as well.  ------------------------------------------------   Donnice Barge, MD [facility]  Radiation Oncology Direct Dial: [phone]  Fax: [phone] [email or web address]  Skype  LinkedIn

## 2024-05-17 ENCOUNTER — Ambulatory Visit
Admission: RE | Admit: 2024-05-17 | Discharge: 2024-05-17 | Disposition: A | Discharge status: Home / Self Care | Source: Ambulatory Visit

## 2024-05-17 NOTE — Progress Notes (Signed)
  Radiation Oncology         [phone] ________________________________  Name: Caleb Robbins MRN: [id number]  Date of Service: 05/17/2024  DOB: [birth year]  Post Treatment Telephone Note Last Treatment Date: 2024-04-20  Diagnosis:   Malignant neoplasm of prostate      The patient was not available for call today. Called x2

## 2024-05-17 NOTE — Progress Notes (Incomplete)
  Nursing interview for a diagnosis of ***.  Patient identity verified x2.   Patient reports ***. Patient denies all other related issues at this time.  Meaningful use complete.  I-PSS score- *** - {(BH) RANGE ABSENT/SEVERE:20013:s} SHIM score- *** Urinary Management medication(s) *** Urology appointment date- *** with Dr. PIERRETTE at [facility]- ***

## 2024-05-24 NOTE — Addendum Note (Signed)
 Encounter addended by: Sherwood Rise, PA-C on: 05/24/2024 3:29 PM  Actions taken: Clinical Note Signed

## 2024-09-25 NOTE — Assessment & Plan Note (Deleted)
 Return on 3 months, visit and leuprolide  CBC, CMP, PSA and testosterone  ordered

## 2024-09-25 NOTE — Progress Notes (Deleted)
  [facility] OFFICE PROGRESS NOTE  Patient Care Team: Towana Small, FNP as PCP - General (Family Medicine) Vertell Pont, RN as Oncology Nurse Navigator  Caleb Robbins is a 78 y.o.male with history of prostate cancer, DM2, gout, HTN, HLD, CKD3 being seen at [facility] for [facility] cancer.   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 He completed IMRT in 10/2020 with LT-ADT in 10/2021.    Current diagnosis: post-RT local recurrence Initial diagnosis: T2c GG4 iPSA 122 Germline testing: not yet Somatic testing: not yet Previous Treatment: concurrent with LT-ADT (completed ADT in 10/2021)   08/28/20-10/30/20: 1. The prostate, seminal vesicles, and pelvic lymph nodes were initially treated to 45 Gy in 25 fractions of 1.8 Gy  2. The prostate only was boosted to 75 Gy with 15 additional fractions of 2.0 Gy   He has rising PSA since completion of radiation in 2022. Most recent PSA was 1.92 in May 2025 from 0.5 in Aug 2024. PET from 03/16/24 showed local prostate recurrence.  The patient was counseled on the natural history of prostate cancer and the standard treatment options that are available for prostate cancer. Given no distant disease patient elected on SBRT. He is not interested in surgery as well even if that is an option.   Discussed ADT, extrapolated data from salvage radiation therapy. He has GG4 disease. Discussed potential side effects. We discussed potentially higher risk with older age, especially with CAD, risk for CVA, MI. Discussed especially with erectile dysfunction. After discussion, he will proceed with one dose on 6/13 at 6 months dosage.  He tolerated well with no side effects.   Report he will leave for about 6 months from July to end of the year.   Daughter is the interpreter with patient's permission.  Assessment & Plan   No orders of the defined types were placed in  this encounter.    Caleb [name], MD  INTERVAL HISTORY: Patient returns for follow-up.  Oncology History  Recurrent prostate adenocarcinoma (HCC)  07/27/2018 Cancer Staging   Out of 12 core biopsies, 10 were positive. All 4+4 GG4. Staging form: Prostate, AJCC 8th Edition - Clinical stage from 07/27/2018: Stage IIIA (cT2c, cN0, cM0, PSA: 122, Grade Group: 4) - Signed by Sherwood Rise, PA-C on 04/06/2020   09/20/2018 Initial Diagnosis   Malignant neoplasm of prostate [facility])  He initially met with radiation oncology on 09/20/2018 and planned with ADT concurrent with 8 weeks of prostate IMRT. He received a 6 month Lupron  injection on 09/27/2018. However, the patient went back home to [place] and due to COVID-19 restrictions, was unable to get back into the [place]  in 2021.   03/23/2020 Imaging   Bone scan: NED   03/23/2020 Imaging   CT AP 1. No lymphadenopathy or other evidence of metastatic disease in the abdomen or pelvis. 2. Chronic diffuse bladder wall thickening, probably due to chronic bladder outlet obstruction by the markedly enlarged heterogeneous prostate. 3. Aortic Atherosclerosis (ICD10-I70.0).   08/28/2020 - 10/30/2020 Radiation Therapy   1. The prostate, seminal vesicles, and pelvic lymph nodes were initially treated to 45 Gy in 25 fractions of 1.8 Gy  2. The prostate only was boosted to 75 Gy with 15 additional fractions of 2.0 Gy    02/12/2024 Tumor Marker   PSA  12/04/20 3.46 04/17/21 1.5 07/17/21 0.84 09/30/21 0.41 12/24/21 0.22 06/18/22 0.16 05/21/23 0.5 02/12/24 1.92   03/16/2024  PET scan   PSMA PET 1. Focal radiotracer activity in the LEFT lobe of the prostate gland could represent prostate cancer recurrence. 2. No evidence of metastatic adenopathy. 3. No visceral metastasis or skeletal metastasis      PHYSICAL EXAMINATION: ECOG PERFORMANCE STATUS: {CHL ONC ECOG PS:[id number]}  There were no vitals filed for this visit. There were no vitals filed for this  visit.  GENERAL: alert, no distress and comfortable SKIN: skin color normal and no jaundice or bruising or petechiae on exposed skin EYES: normal, sclera clear OROPHARYNX: no exudate  NECK: No palpable mass LYMPH:  no palpable cervical, axillary lymphadenopathy  LUNGS: clear to auscultation and no wheeze or rales with normal breathing effort HEART: regular rate & rhythm  ABDOMEN: abdomen soft, non-tender and nondistended. Musculoskeletal: no edema NEURO: no focal motor/sensory deficits  Relevant data reviewed during this visit included labs.  New labs ordered.

## 2024-09-26 ENCOUNTER — Inpatient Hospital Stay

## 2024-09-26 ENCOUNTER — Inpatient Hospital Stay: Attending: Family Medicine

## 2024-09-26 ENCOUNTER — Telehealth: Payer: Self-pay | Admitting: *Deleted

## 2024-09-26 DIAGNOSIS — C61 Malignant neoplasm of prostate: Secondary | ICD-10-CM

## 2024-09-26 NOTE — Telephone Encounter (Signed)
 LM to call Dr Fredrik office regarding missed appts

## 2024-10-21 ENCOUNTER — Telehealth: Payer: Self-pay

## 2024-10-21 NOTE — Telephone Encounter (Unsigned)
 Copied from CRM [id number]. Topic: General - Other >> Oct 21, 2024  2:38 PM Avram MATSU wrote: Reason for CRM: Patient moved and would like his medication to be sent to   [facility] [address] [facility] [place]  [phone]

## 2024-10-24 ENCOUNTER — Ambulatory Visit: Admitting: Family Medicine

## 2024-10-24 DIAGNOSIS — E1169 Type 2 diabetes mellitus with other specified complication: Secondary | ICD-10-CM

## 2024-10-24 DIAGNOSIS — E785 Hyperlipidemia, unspecified: Secondary | ICD-10-CM

## 2024-10-24 DIAGNOSIS — Z7984 Long term (current) use of oral hypoglycemic drugs: Secondary | ICD-10-CM

## 2024-10-24 DIAGNOSIS — I10 Essential (primary) hypertension: Secondary | ICD-10-CM | POA: Diagnosis not present

## 2024-10-24 DIAGNOSIS — E1165 Type 2 diabetes mellitus with hyperglycemia: Secondary | ICD-10-CM

## 2024-10-24 DIAGNOSIS — N1832 Chronic kidney disease, stage 3b: Secondary | ICD-10-CM

## 2024-10-24 DIAGNOSIS — R399 Unspecified symptoms and signs involving the genitourinary system: Secondary | ICD-10-CM | POA: Diagnosis not present

## 2024-10-24 MED ORDER — CHLORTHALIDONE 25 MG PO TABS: 25.0000 mg | ORAL_TABLET | Freq: Every day | ORAL | 2 refills | Status: AC | PRN

## 2024-10-24 MED ORDER — LATANOPROST 0.005 % OP SOLN: 1.0000 [drp] | Freq: Every day | OPHTHALMIC | 4 refills | Status: AC

## 2024-10-24 MED ORDER — GABAPENTIN 100 MG PO CAPS: 100.0000 mg | ORAL_CAPSULE | Freq: Three times a day (TID) | ORAL | 0 refills | Status: AC

## 2024-10-24 MED ORDER — GLIMEPIRIDE 1 MG PO TABS: ORAL_TABLET | ORAL | 1 refills | Status: AC

## 2024-10-24 MED ORDER — BRIMONIDINE TARTRATE-TIMOLOL 0.2-0.5 % OP SOLN: 1.0000 [drp] | Freq: Every day | OPHTHALMIC | 2 refills | Status: AC

## 2024-10-24 MED ORDER — TADALAFIL 5 MG PO TABS: 5.0000 mg | ORAL_TABLET | Freq: Every day | ORAL | 0 refills | Status: AC | PRN

## 2024-10-24 MED ORDER — TAMSULOSIN HCL 0.4 MG PO CAPS: 0.4000 mg | ORAL_CAPSULE | Freq: Every day | ORAL | 3 refills | Status: AC

## 2024-10-24 MED ORDER — DAPAGLIFLOZIN PROPANEDIOL 10 MG PO TABS: 10.0000 mg | ORAL_TABLET | Freq: Every day | ORAL | 1 refills | Status: AC

## 2024-10-24 MED ORDER — ALLOPURINOL 100 MG PO TABS: 50.0000 mg | ORAL_TABLET | Freq: Every day | ORAL | 1 refills | Status: AC

## 2024-10-24 MED ORDER — OXYBUTYNIN CHLORIDE ER 10 MG PO TB24: 10.0000 mg | ORAL_TABLET | Freq: Every day | ORAL | 2 refills | Status: AC

## 2024-10-24 MED ORDER — LOSARTAN POTASSIUM 100 MG PO TABS: 100.0000 mg | ORAL_TABLET | Freq: Every day | ORAL | 1 refills | Status: AC

## 2024-10-24 MED ORDER — ATORVASTATIN CALCIUM 20 MG PO TABS: 20.0000 mg | ORAL_TABLET | Freq: Every day | ORAL | 3 refills | Status: AC

## 2024-10-24 MED ORDER — METFORMIN HCL 500 MG PO TABS: 500.0000 mg | ORAL_TABLET | Freq: Every day | ORAL | 1 refills | Status: AC

## 2024-10-24 MED ORDER — CALCITRIOL 0.25 MCG PO CAPS: 0.2500 ug | ORAL_CAPSULE | Freq: Every day | ORAL | 1 refills | Status: AC

## 2024-10-24 MED ORDER — AMLODIPINE BESYLATE 10 MG PO TABS: 10.0000 mg | ORAL_TABLET | Freq: Every day | ORAL | 1 refills | Status: AC

## 2024-10-24 NOTE — Patient Instructions (Signed)

## 2024-10-24 NOTE — Progress Notes (Signed)
 "  Established Patient Office Visit  Subjective  Patient ID: Caleb Robbins, male    DOB: 05-Aug-1946  Age: 79 y.o. MRN: [id number]  Chief Complaint  Patient presents with   pt. here for a follow-up visit    Pt. Here for a follow-up visit. Pt. Denies pain at this time. n   Discussed the use of AI scribe software for clinical note transcription with the patient, who gave verbal consent to proceed.  History of Present Illness   Caleb Robbins is a 79 year old male with hypertension and diabetes who presents for medication refills and routine follow-up.  His blood sugar levels have been stable, with recent readings of 108, 124, 121, 111, 115, 123, 113, 118, 111, and 109 mg/dL, which he checks in the morning. He is currently on Farxiga  10mg  daily, glimepiride  1mg  BID, and metformin  500mg  daily.   His blood pressure readings have been around 134/68 mmHg. Home readings include:  134/64, 138/60, 140/72, 139/59, 132/63, 147/66, 149/64, 167/73, 148/70, 157/71. He is currently on amlodipine  10mg  daily, chlorthalidone  25mg  daily, losartan  100mg  daily for hypertension.  ROS: see HPI     Objective:     BP (!) 142/81 (BP Location: Right Arm, Patient Position: Sitting, Cuff Size: Normal)   Pulse 72   Temp 98.5 F (36.9 C) (Oral)   Resp 17   Wt 191 lb 9.6 oz (86.9 kg)   SpO2 95%   BMI 30.01 kg/m  BP Readings from Last 3 Encounters:  10/24/24 (!) 142/81  04/18/24 (!) 160/73  04/06/24 (!) 153/88    Physical Exam Vitals reviewed.  Constitutional:      Appearance: Normal appearance.  Cardiovascular:     Rate and Rhythm: Normal rate and regular rhythm.     Pulses: Normal pulses.     Heart sounds: Normal heart sounds.  Pulmonary:     Effort: Pulmonary effort is normal.     Breath sounds: Normal breath sounds.  Neurological:     Mental Status: He is alert.  Psychiatric:        Mood and Affect: Mood normal.        Behavior: Behavior normal.     Assessment & Plan:   1. Type 2 diabetes  mellitus with hyperglycemia, without long-term current use of insulin (HCC) Blood sugars are well controlled. Refills sent to pharmacy. Will repeat hemoglobin A1c today to determine if medication regimen needs adjusted.  - Comprehensive metabolic panel with GFR - Hemoglobin A1c - gabapentin  (NEURONTIN ) 100 MG capsule; Take 1 capsule (100 mg total) by mouth 3 (three) times daily.  Dispense: 540 capsule; Refill: 0 - glimepiride  (AMARYL ) 1 MG tablet; TAKE 1 TABLET BY MOUTH TWICE DAILY  Dispense: 180 tablet; Refill: 1 - metFORMIN  (GLUCOPHAGE ) 500 MG tablet; Take 1 tablet (500 mg total) by mouth daily with breakfast.  Dispense: 90 tablet; Refill: 1 - dapagliflozin  propanediol (FARXIGA ) 10 MG TABS tablet; Take 1 tablet (10 mg total) by mouth daily.  Dispense: 90 tablet; Refill: 1  2. Essential hypertension Blood pressure generally well-controlled with average being around 134/80 mmHg. Slight improvement noted, decent readings for age. Continue amlodipine , chlorthalidone  and losartan . Refills sent to pharmacy on file.  - Comprehensive metabolic panel with GFR - losartan  (COZAAR ) 100 MG tablet; Take 1 tablet (100 mg total) by mouth daily.  Dispense: 90 tablet; Refill: 1 - amLODipine  (NORVASC ) 10 MG tablet; Take 1 tablet (10 mg total) by mouth daily.  Dispense: 90 tablet; Refill: 1  3. Lower urinary tract  symptoms (LUTS) Refills sent to pharmacy.  - tadalafil  (CIALIS ) 5 MG tablet; Take 1 tablet (5 mg total) by mouth daily as needed for erectile dysfunction.  Dispense: 30 tablet; Refill: 0 - tamsulosin  (FLOMAX ) 0.4 MG CAPS capsule; Take 1 capsule (0.4 mg total) by mouth daily.  Dispense: 90 capsule; Refill: 3  4. Hyperlipidemia associated with type 2 diabetes mellitus (HCC) Refills sent to pharmacy.  - atorvastatin  (LIPITOR) 20 MG tablet; Take 1 tablet (20 mg total) by mouth daily.  Dispense: 90 tablet; Refill: 3  5. Stage 3b chronic kidney disease (CKD) (HCC) Followed by nephrology. Recommendations  include remaining on losartan  and Farxiga  to delay kidney disease progression. Will repeat kidney function and potassium level today.  - dapagliflozin  propanediol (FARXIGA ) 10 MG TABS tablet; Take 1 tablet (10 mg total) by mouth daily.  Dispense: 90 tablet; Refill: 1    Return in about 6 months (around 04/23/2025) for HTN follow-up, Diabetes f/u.    Evalene Arts, FNP "

## 2024-10-25 ENCOUNTER — Ambulatory Visit: Payer: Self-pay | Admitting: Family Medicine

## 2024-10-25 LAB — COMPREHENSIVE METABOLIC PANEL WITH GFR
ALT: 20 IU/L (ref 0–44)
AST: 20 IU/L (ref 0–40)
Albumin: 4.1 g/dL (ref 3.8–4.8)
Alkaline Phosphatase: 77 IU/L (ref 47–123)
BUN/Creatinine Ratio: 9 — ABNORMAL LOW (ref 10–24)
BUN: 13 mg/dL (ref 8–27)
Bilirubin Total: 0.2 mg/dL (ref 0.0–1.2)
CO2: 22 mmol/L (ref 20–29)
Calcium: 10.4 mg/dL — ABNORMAL HIGH (ref 8.6–10.2)
Chloride: 107 mmol/L — ABNORMAL HIGH (ref 96–106)
Creatinine, Ser: 1.48 mg/dL — ABNORMAL HIGH (ref 0.76–1.27)
Globulin, Total: 2.8 g/dL (ref 1.5–4.5)
Glucose: 156 mg/dL — ABNORMAL HIGH (ref 70–99)
Potassium: 4.1 mmol/L (ref 3.5–5.2)
Sodium: 143 mmol/L (ref 134–144)
Total Protein: 6.9 g/dL (ref 6.0–8.5)
eGFR: 48 mL/min/1.73 — ABNORMAL LOW

## 2024-10-25 LAB — HEMOGLOBIN A1C
Est. average glucose Bld gHb Est-mCnc: 174 mg/dL
Hgb A1c MFr Bld: 7.7 % — ABNORMAL HIGH (ref 4.8–5.6)

## 2025-04-24 ENCOUNTER — Ambulatory Visit: Admitting: Family Medicine
